# Patient Record
Sex: Male | Born: 1935 | Race: Black or African American | Hispanic: No | Marital: Single | Smoking: Never smoker
Health system: Southern US, Community
[De-identification: ages and names within clinical notes are randomized; demographics above are authoritative.]

## PROBLEM LIST (undated history)

## (undated) DIAGNOSIS — I493 Ventricular premature depolarization: Secondary | ICD-10-CM

## (undated) DIAGNOSIS — I509 Heart failure, unspecified: Secondary | ICD-10-CM

## (undated) DIAGNOSIS — M1990 Unspecified osteoarthritis, unspecified site: Secondary | ICD-10-CM

---

## 1997-06-01 ENCOUNTER — Other Ambulatory Visit: Admission: RE | Admit: 1997-06-01 | Discharge: 1997-06-01 | Payer: Self-pay | Admitting: Family Medicine

## 2009-11-22 ENCOUNTER — Encounter: Admission: RE | Admit: 2009-11-22 | Discharge: 2009-11-22 | Payer: Self-pay | Admitting: Orthopedic Surgery

## 2013-06-15 ENCOUNTER — Encounter (HOSPITAL_COMMUNITY): Payer: Self-pay | Admitting: Emergency Medicine

## 2013-06-15 ENCOUNTER — Inpatient Hospital Stay (HOSPITAL_COMMUNITY)
Admission: EM | Admit: 2013-06-15 | Discharge: 2013-06-18 | DRG: 291 | Disposition: A | Discharge status: Home / Self Care | Payer: Medicare Other | Attending: Internal Medicine | Admitting: Internal Medicine

## 2013-06-15 ENCOUNTER — Emergency Department (HOSPITAL_COMMUNITY): Payer: Medicare Other

## 2013-06-15 DIAGNOSIS — R9431 Abnormal electrocardiogram [ECG] [EKG]: Secondary | ICD-10-CM | POA: Diagnosis present

## 2013-06-15 DIAGNOSIS — Z713 Dietary counseling and surveillance: Secondary | ICD-10-CM

## 2013-06-15 DIAGNOSIS — J9601 Acute respiratory failure with hypoxia: Secondary | ICD-10-CM

## 2013-06-15 DIAGNOSIS — M1710 Unilateral primary osteoarthritis, unspecified knee: Secondary | ICD-10-CM | POA: Diagnosis present

## 2013-06-15 DIAGNOSIS — I5021 Acute systolic (congestive) heart failure: Principal | ICD-10-CM | POA: Diagnosis present

## 2013-06-15 DIAGNOSIS — R5381 Other malaise: Secondary | ICD-10-CM | POA: Diagnosis present

## 2013-06-15 DIAGNOSIS — I428 Other cardiomyopathies: Secondary | ICD-10-CM | POA: Diagnosis present

## 2013-06-15 DIAGNOSIS — I509 Heart failure, unspecified: Secondary | ICD-10-CM | POA: Diagnosis present

## 2013-06-15 DIAGNOSIS — M170 Bilateral primary osteoarthritis of knee: Secondary | ICD-10-CM

## 2013-06-15 DIAGNOSIS — M1990 Unspecified osteoarthritis, unspecified site: Secondary | ICD-10-CM | POA: Diagnosis present

## 2013-06-15 DIAGNOSIS — J9600 Acute respiratory failure, unspecified whether with hypoxia or hypercapnia: Secondary | ICD-10-CM | POA: Diagnosis present

## 2013-06-15 DIAGNOSIS — I420 Dilated cardiomyopathy: Secondary | ICD-10-CM

## 2013-06-15 HISTORY — DX: Unspecified osteoarthritis, unspecified site: M19.90

## 2013-06-15 HISTORY — DX: Ventricular premature depolarization: I49.3

## 2013-06-15 LAB — CBC
HCT: 40.4 % (ref 39.0–52.0)
Hemoglobin: 14.5 g/dL (ref 13.0–17.0)
MCH: 28.2 pg (ref 26.0–34.0)
MCHC: 35.9 g/dL (ref 30.0–36.0)
MCV: 78.6 fL (ref 78.0–100.0)
PLATELETS: 153 10*3/uL (ref 150–400)
RBC: 5.14 MIL/uL (ref 4.22–5.81)
RDW: 15.9 % — ABNORMAL HIGH (ref 11.5–15.5)
WBC: 8.6 10*3/uL (ref 4.0–10.5)

## 2013-06-15 LAB — CBC WITH DIFFERENTIAL/PLATELET
BASOS ABS: 0 10*3/uL (ref 0.0–0.1)
BASOS PCT: 1 % (ref 0–1)
Eosinophils Absolute: 0.1 10*3/uL (ref 0.0–0.7)
Eosinophils Relative: 1 % (ref 0–5)
HCT: 40.6 % (ref 39.0–52.0)
Hemoglobin: 14.6 g/dL (ref 13.0–17.0)
Lymphocytes Relative: 21 % (ref 12–46)
Lymphs Abs: 1.6 10*3/uL (ref 0.7–4.0)
MCH: 28.1 pg (ref 26.0–34.0)
MCHC: 36 g/dL (ref 30.0–36.0)
MCV: 78.1 fL (ref 78.0–100.0)
Monocytes Absolute: 0.7 10*3/uL (ref 0.1–1.0)
Monocytes Relative: 9 % (ref 3–12)
NEUTROS ABS: 5.3 10*3/uL (ref 1.7–7.7)
NEUTROS PCT: 69 % (ref 43–77)
Platelets: 150 10*3/uL (ref 150–400)
RBC: 5.2 MIL/uL (ref 4.22–5.81)
RDW: 15.9 % — AB (ref 11.5–15.5)
WBC: 7.7 10*3/uL (ref 4.0–10.5)

## 2013-06-15 LAB — POTASSIUM: Potassium: 4 mEq/L (ref 3.7–5.3)

## 2013-06-15 LAB — COMPREHENSIVE METABOLIC PANEL
ALBUMIN: 3.4 g/dL — AB (ref 3.5–5.2)
ALK PHOS: 52 U/L (ref 39–117)
ALT: 19 U/L (ref 0–53)
AST: 20 U/L (ref 0–37)
BILIRUBIN TOTAL: 0.7 mg/dL (ref 0.3–1.2)
BUN: 18 mg/dL (ref 6–23)
CHLORIDE: 104 meq/L (ref 96–112)
CO2: 21 mEq/L (ref 19–32)
Calcium: 9.1 mg/dL (ref 8.4–10.5)
Creatinine, Ser: 1.3 mg/dL (ref 0.50–1.35)
GFR calc Af Amer: 53 mL/min — ABNORMAL LOW (ref >90)
GFR calc non Af Amer: 46 mL/min — ABNORMAL LOW (ref >90)
Glucose, Bld: 107 mg/dL — ABNORMAL HIGH (ref 70–99)
POTASSIUM: 4.5 meq/L (ref 3.7–5.3)
SODIUM: 137 meq/L (ref 137–147)
Total Protein: 6.6 g/dL (ref 6.0–8.3)

## 2013-06-15 LAB — TROPONIN I
Troponin I: <0.3 ng/mL (ref <0.30)
Troponin I: <0.3 ng/mL (ref <0.30)
Troponin I: <0.3 ng/mL (ref <0.30)

## 2013-06-15 LAB — CREATININE, SERUM
Creatinine, Ser: 1.38 mg/dL — ABNORMAL HIGH (ref 0.50–1.35)
GFR calc Af Amer: 49 mL/min — ABNORMAL LOW (ref >90)
GFR calc non Af Amer: 42 mL/min — ABNORMAL LOW (ref >90)

## 2013-06-15 LAB — PRO B NATRIURETIC PEPTIDE: PRO B NATRI PEPTIDE: 7124 pg/mL — AB (ref 0–450)

## 2013-06-15 MED ORDER — ENOXAPARIN SODIUM 40 MG/0.4ML ~~LOC~~ SOLN
40.0000 mg | SUBCUTANEOUS | Status: DC
  Administered 2013-06-15 – 2013-06-17 (×3): 40 mg via SUBCUTANEOUS
  Filled 2013-06-15 (×4): qty 0.4

## 2013-06-15 MED ORDER — ASPIRIN EC 81 MG PO TBEC
81.0000 mg | DELAYED_RELEASE_TABLET | Freq: Every day | ORAL | Status: DC
  Administered 2013-06-15 – 2013-06-18 (×4): 81 mg via ORAL
  Filled 2013-06-15 (×4): qty 1

## 2013-06-15 MED ORDER — SODIUM CHLORIDE 0.9 % IJ SOLN
3.0000 mL | INTRAMUSCULAR | Status: DC | PRN
  Administered 2013-06-17 – 2013-06-18 (×2): 3 mL via INTRAVENOUS

## 2013-06-15 MED ORDER — DIAZEPAM 5 MG PO TABS: 5.0000 mg | ORAL_TABLET | Freq: Once | ORAL | Status: DC

## 2013-06-15 MED ORDER — FUROSEMIDE 10 MG/ML IJ SOLN
40.0000 mg | Freq: Once | INTRAMUSCULAR | Status: AC
  Administered 2013-06-15: 40 mg via INTRAVENOUS
  Filled 2013-06-15: qty 4

## 2013-06-15 MED ORDER — SODIUM CHLORIDE 0.9 % IV SOLN: 250.0000 mL | INTRAVENOUS | Status: DC | PRN

## 2013-06-15 MED ORDER — ONDANSETRON HCL 4 MG/2ML IJ SOLN: 4.0000 mg | Freq: Four times a day (QID) | INTRAMUSCULAR | Status: DC | PRN

## 2013-06-15 MED ORDER — FUROSEMIDE 10 MG/ML IJ SOLN
40.0000 mg | Freq: Every day | INTRAMUSCULAR | Status: DC
  Administered 2013-06-15: 40 mg via INTRAVENOUS
  Filled 2013-06-15 (×2): qty 4

## 2013-06-15 MED ORDER — TRAMADOL HCL 50 MG PO TABS: 50.0000 mg | ORAL_TABLET | Freq: Four times a day (QID) | ORAL | Status: DC | PRN

## 2013-06-15 MED ORDER — ASPIRIN 81 MG PO CHEW
324.0000 mg | CHEWABLE_TABLET | Freq: Once | ORAL | Status: AC
  Administered 2013-06-15: 324 mg via ORAL
  Filled 2013-06-15: qty 4

## 2013-06-15 MED ORDER — SODIUM CHLORIDE 0.9 % IJ SOLN
3.0000 mL | Freq: Two times a day (BID) | INTRAMUSCULAR | Status: DC
  Administered 2013-06-15 – 2013-06-17 (×3): 3 mL via INTRAVENOUS

## 2013-06-15 MED ORDER — ACETAMINOPHEN 325 MG PO TABS
650.0000 mg | ORAL_TABLET | Freq: Four times a day (QID) | ORAL | Status: DC | PRN
  Administered 2013-06-15 – 2013-06-17 (×2): 650 mg via ORAL
  Filled 2013-06-15 (×2): qty 2

## 2013-06-15 NOTE — ED Notes (Signed)
Report given-transfer to 4th floor

## 2013-06-15 NOTE — ED Notes (Signed)
Per EMS-increased LE edema in past week, increasingly painful-increased dyspnea with exertion for past week-clear lung sounds-97% of 4L [place], which he states he feels better, 90% on RA-SR with PVCs-18g Left hand

## 2013-06-15 NOTE — ED Notes (Signed)
Attempted to call report

## 2013-06-15 NOTE — ED Provider Notes (Signed)
CSN: [id number]     Arrival date & time 06/15/13  0804 History   First MD Initiated Contact with Patient 06/15/13 [id number]     Chief Complaint  Patient presents with  . Shortness of Breath     (Consider location/radiation/quality/duration/timing/severity/associated sxs/prior Treatment) HPI 80 year old male presents with 3 days of worsening shortness of breath. He has also been having bilateral lower extremity swelling over last couple days. He's been complaining of chest congestion, cough with white sputum, and rhinorrhea. Denies any fevers or chills. When asked about chest pain he states sometimes he gets a tightness in his chest, especially in the morning. Denies any current pain or tightness. No prior cardiac problems besides PVCs. No history of heart failure. His daughter at the bedside lives with him and states that the symptoms seem to be worse in the morning and overnight. It does seem to be worse with lying flat and better with sitting up. He does not normally ambulate. His leg swelling has been symmetric. When oxygen was placed on him on arrival to the ER he states that seemed improved his dyspnea.  Past Medical History  Diagnosis Date  . Arthritis   . PVC (premature ventricular contraction)    History reviewed. No pertinent past surgical history. No family history on file. History  Substance Use Topics  . Smoking status: Never Smoker   . Smokeless tobacco: Not on file  . Alcohol Use: No    Review of Systems  Constitutional: Negative for fever, chills and diaphoresis.  HENT: Positive for congestion and rhinorrhea. Negative for sore throat.   Respiratory: Positive for cough, chest tightness and shortness of breath.   Cardiovascular: Positive for leg swelling. Negative for chest pain.  Gastrointestinal: Negative for nausea, vomiting and abdominal pain.  Neurological: Negative for headaches.  All other systems reviewed and are negative.     Allergies  Review of patient's  allergies indicates not on file.  Home Medications   Prior to Admission medications   Not on File   BP 120/92  Pulse 92  Temp(Src) 97.9 F (36.6 C) (Oral)  Resp 22  SpO2 100% Physical Exam  Nursing note and vitals reviewed. Constitutional: He is oriented to person, place, and time. He appears well-developed and well-nourished. No distress.  HENT:  Head: Normocephalic and atraumatic.  Right Ear: External ear normal.  Left Ear: External ear normal.  Nose: Nose normal.  Mouth/Throat: Oropharynx is clear and moist. No oropharyngeal exudate.  Eyes: Right eye exhibits no discharge. Left eye exhibits no discharge.  Neck: Neck supple.  Cardiovascular: Normal rate, regular rhythm, normal heart sounds and intact distal pulses.   Pulmonary/Chest: Tachypnea (mild) noted. No respiratory distress. He has decreased breath sounds in the right lower field and the left lower field. He has wheezes (scant, intermittent expiratory wheezes).  Abdominal: Soft. He exhibits no distension. There is no tenderness.  Musculoskeletal: He exhibits edema (bilateral, pitting edema to mid lower legs).  Neurological: He is alert and oriented to person, place, and time.  Skin: Skin is warm and dry.    ED Course  Procedures (including critical care time) Labs Review Labs Reviewed  CBC WITH DIFFERENTIAL - Abnormal; Notable for the following:    RDW 15.9 (*)    All other components within normal limits  COMPREHENSIVE METABOLIC PANEL - Abnormal; Notable for the following:    Glucose, Bld 107 (*)    Albumin 3.4 (*)    GFR calc non Af Amer 46 (*)  GFR calc Af Amer 53 (*)    All other components within normal limits  PRO B NATRIURETIC PEPTIDE - Abnormal; Notable for the following:    Pro B Natriuretic peptide (BNP) 7124.0 (*)    All other components within normal limits  TROPONIN I    Imaging Review Dg Chest 2 View  06/15/2013   CLINICAL DATA:  80 year old male cough, wheezing, shortness of Breath.  Initial encounter.  EXAM: CHEST  2 VIEW  COMPARISON:  None.  FINDINGS: Semi upright AP and lateral views of the chest. Cardiomegaly. Evidence of moderate pleural effusions. Patchy and confluent bibasilar opacity. Upper lobes appear more clear. No pneumothorax. No acute osseous abnormality identified.  IMPRESSION: Cardiomegaly with moderate pleural effusions. Patchy bibasilar opacity could reflect basilar predominant pulmonary edema or less likely pneumonia.   Electronically Signed   By: Augusto GambleLee  Hall M.D.   On: 06/15/2013 09:09     EKG Interpretation   Date/Time:  Wednesday Jun 15 2013 08:28:47 EDT Ventricular Rate:  94 PR Interval:  255 QRS Duration: 116 QT Interval:  357 QTC Calculation: 446 R Axis:   47 Text Interpretation:  Sinus rhythm Ventricular premature complex Prolonged  PR interval Nonspecific intraventricular conduction delay Nonspecific T  abnormalities, lateral leads No old tracing to compare Confirmed by  Shimshon Narula  MD, Mayola Mcbain ([id number]) on 06/15/2013 8:39:03 AM      MDM   Final diagnoses:  Congestive heart failure    Patient's sats 90% RA. No respiratory distress but does have mild increased WOB. With his orthopnea and leg swelling his presentation is more c/w CHF rather than PNA. Not c/w PE. No active chest pain.  Given his mild hypoxia and tachypnea he will need admission and echo. Will give lasix IV in ED.    Audree CamelScott T Denita Lun, MD 06/15/13 1026

## 2013-06-15 NOTE — H&P (Signed)
[facility]          History and Physical    PCP:   Foye Spurling, MD   Chief Complaint:  Shortness of breath, chest pain  HPI: Patient is a 80 year old man with past medical history only significant for bilateral knee osteoarthritis for which he takes naproxen. Is followed at the [place]. He presents today with about a 3 to four-day history of progressive shortness of breath. It has worsened to the point where he has difficulty lying down flat at night and has had to increase the amount of pillows that he uses. He also noticed today some substernal chest pressure that did not radiate. He has also had some progressive lower extremity edema. Because of all of these issues his children decided to bring him into the hospital today for evaluation. He is found to have a chest x-ray consistent with pulmonary edema, 2-3+ bilateral lower extremity pitting edema, a BNP of greater than 7000. Hospitalist admission has been requested.  Allergies:  No Known Allergies    Past Medical History  Diagnosis Date  . Arthritis   . PVC (premature ventricular contraction)     History reviewed. No pertinent past surgical history.  Prior to Admission medications   Not on File    Social History:  reports that he has never smoked. He has never used smokeless tobacco. He reports that he does not drink alcohol or use illicit drugs.  History reviewed. No pertinent family history.  Review of Systems:  Constitutional: Denies fever, chills, diaphoresis, appetite change and fatigue.  HEENT: Denies photophobia, eye pain, redness, hearing loss, ear pain, congestion, sore throat, rhinorrhea, sneezing, mouth sores, trouble swallowing, neck pain, neck stiffness and tinnitus.   Respiratory: Denies wheezing.   Cardiovascular: Denies palpitations. Gastrointestinal: Denies nausea, vomiting, abdominal pain, diarrhea, constipation, blood in stool and abdominal distention.  Genitourinary: Denies  dysuria, urgency, frequency, hematuria, flank pain and difficulty urinating.  Endocrine: Denies: hot or cold intolerance, sweats, changes in hair or nails, polyuria, polydipsia. Musculoskeletal: Denies myalgias, back pain, joint swelling, arthralgias and gait problem.  Skin: Denies pallor, rash and wound.  Neurological: Denies dizziness, seizures, syncope, weakness, light-headedness, numbness and headaches.  Hematological: Denies adenopathy. Easy bruising, personal or family bleeding history  Psychiatric/Behavioral: Denies suicidal ideation, mood changes, confusion, nervousness, sleep disturbance and agitation   Physical Exam: Blood pressure 107/75, pulse 87, temperature 97.8 F (36.6 C), temperature source Oral, resp. rate 22, height _0  (1.854 m), SpO2 98.00%. General: Alert, awake, oriented x3, in no distress, hard of hearing. HEENT: Normocephalic, atraumatic, reactive to light, sharp movements intact, moist mucous membranes, poor dentition. Neck: Supple, no JVD, no lymphadenopathy, no bruits, no goiter. Cardiovascular: Regular rate and rhythm, no murmurs, rubs or gallops auscultated. Lungs: Bibasilar crackles. Abdomen: Soft, nontender, nondistended, positive bowel sounds. Extremities: 2-3+ pitting edema bilaterally up to about midshin. Neurologic: Grossly intact and nonfocal.  Labs on Admission:  Results for orders placed during the hospital encounter of 06/15/13 (from the past 48 hour(s))  CBC WITH DIFFERENTIAL     Status: Abnormal   Collection Time    06/15/13  9:15 AM      Result Value Ref Range   WBC 7.7  4.0 - 10.5 K/uL   RBC 5.20  4.22 - 5.81 MIL/uL   Hemoglobin 14.6  13.0 - 17.0 g/dL   HCT 40.6  39.0 - 52.0 %   MCV 78.1  78.0 -  100.0 fL   MCH 28.1  26.0 - 34.0 pg   MCHC 36.0  30.0 - 36.0 g/dL   RDW 15.9 (*) 11.5 - 15.5 %   Platelets 150  150 - 400 K/uL   Neutrophils Relative % 69  43 - 77 %   Neutro Abs 5.3  1.7 - 7.7 K/uL   Lymphocytes Relative 21  12 - 46 %    Lymphs Abs 1.6  0.7 - 4.0 K/uL   Monocytes Relative 9  3 - 12 %   Monocytes Absolute 0.7  0.1 - 1.0 K/uL   Eosinophils Relative 1  0 - 5 %   Eosinophils Absolute 0.1  0.0 - 0.7 K/uL   Basophils Relative 1  0 - 1 %   Basophils Absolute 0.0  0.0 - 0.1 K/uL  COMPREHENSIVE METABOLIC PANEL     Status: Abnormal   Collection Time    06/15/13  9:15 AM      Result Value Ref Range   Sodium 137  137 - 147 mEq/L   Potassium 4.5  3.7 - 5.3 mEq/L   Chloride 104  96 - 112 mEq/L   CO2 21  19 - 32 mEq/L   Glucose, Bld 107 (*) 70 - 99 mg/dL   BUN 18  6 - 23 mg/dL   Creatinine, Ser 1.30  0.50 - 1.35 mg/dL   Calcium 9.1  8.4 - 10.5 mg/dL   Total Protein 6.6  6.0 - 8.3 g/dL   Albumin 3.4 (*) 3.5 - 5.2 g/dL   AST 20  0 - 37 U/L   ALT 19  0 - 53 U/L   Alkaline Phosphatase 52  39 - 117 U/L   Total Bilirubin 0.7  0.3 - 1.2 mg/dL   GFR calc non Af Amer 46 (*) >90 mL/min   GFR calc Af Amer 53 (*) >90 mL/min   Comment: (NOTE)     The eGFR has been calculated using the CKD EPI equation.     This calculation has not been validated in all clinical situations.     eGFR's persistently <90 mL/min signify possible Chronic Kidney     Disease.  TROPONIN I     Status: None   Collection Time    06/15/13  9:15 AM      Result Value Ref Range   Troponin I <0.30  <0.30 ng/mL   Comment:            Due to the release kinetics of cTnI,     a negative result within the first hours     of the onset of symptoms does not rule out     myocardial infarction with certainty.     If myocardial infarction is still suspected,     repeat the test at appropriate intervals.  PRO B NATRIURETIC PEPTIDE     Status: Abnormal   Collection Time    06/15/13  9:15 AM      Result Value Ref Range   Pro B Natriuretic peptide (BNP) 7124.0 (*) 0 - 450 pg/mL    Radiological Exams on Admission: Dg Chest 2 View  06/15/2013   CLINICAL DATA:  80 year old male cough, wheezing, shortness of Breath. Initial encounter.  EXAM: CHEST  2 VIEW   COMPARISON:  None.  FINDINGS: Semi upright AP and lateral views of the chest. Cardiomegaly. Evidence of moderate pleural effusions. Patchy and confluent bibasilar opacity. Upper lobes appear more clear. No pneumothorax. No acute osseous abnormality identified.  IMPRESSION: Cardiomegaly  with moderate pleural effusions. Patchy bibasilar opacity could reflect basilar predominant pulmonary edema or less likely pneumonia.   Electronically Signed   By: Lars Pinks M.D.   On: 06/15/2013 09:09    Assessment/Plan Principal Problem:   Acute CHF Active Problems:   Acute respiratory failure with hypoxia   Osteoarthritis of both knees    Acute hypoxemic respiratory failure -Likely related to acute CHF, doubt pneumonia given lack of clinical findings. -Please see below for details.  Acute CHF, type unknown, likely diastolic -Chest x-ray consistent with pulmonary edema, BNP greater than 7000. -He feels like his shortness of breath has already improved with the Lasix that he received in the emergency department. -Check 2-D echo, cycle troponins, strict intake and output, daily weights, strive for negative fluid balance.  DVT prophylaxis -Lovenox.  CODE STATUS -Full code as discussed with patient and 4 children at bedside.    Time Spent on Admission: 75 minutes  [facility] Pager: [phone] 06/15/2013, 3:43 PM

## 2013-06-15 NOTE — ED Notes (Signed)
Bed: FT73 Expected date:  Expected time:  Means of arrival:  Comments: ems 80yo SOB

## 2013-06-15 NOTE — ED Notes (Signed)
Pt has urinal at bedside at his request, was instructed to call out when he had urinated for sample collection

## 2013-06-15 NOTE — Care Management Note (Addendum)
    Page 1 of 1   06/16/2013     4:37:15 PM CARE MANAGEMENT NOTE 06/16/2013  Patient:  AYDIN, BANO A   Account Number:  [id number]  Date Initiated:  06/15/2013  Documentation initiated by:  [facility]  Subjective/Objective Assessment:   80 Y/O M ADMITTED W/CHF.     Action/Plan:   FROM HOME W/GOOD FAMILY SUPPORT.HAS PCP,PHARMACY.   Anticipated DC Date:  06/20/2013   Anticipated DC Plan:  HOME W HOME HEALTH SERVICES      DC Planning Services  CM consult      Choice offered to / List presented to:             Status of service:  In process, will continue to follow Medicare Important Message given?   (If response is "NO", the following Medicare IM given date fields will be blank) Date Medicare IM given:   Date Additional Medicare IM given:    Discharge Disposition:    Per UR Regulation:  Reviewed for med. necessity/level of care/duration of stay  If discussed at Long Length of Stay Meetings, dates discussed:    Comments:  06/16/13 Tamaiya Bump RN,BSN NCM [phone] FAMILY DECLINES HHC.[facility] PROVIDES ASST W/ADL'S.   06/15/13 Jacqui Headen RN,BSN NCM [phone] WOULD RECOMMEND PT CONS.

## 2013-06-16 DIAGNOSIS — IMO0002 Reserved for concepts with insufficient information to code with codable children: Secondary | ICD-10-CM

## 2013-06-16 DIAGNOSIS — M1710 Unilateral primary osteoarthritis, unspecified knee: Secondary | ICD-10-CM

## 2013-06-16 LAB — BASIC METABOLIC PANEL
BUN: 22 mg/dL (ref 6–23)
CHLORIDE: 104 meq/L (ref 96–112)
CO2: 25 meq/L (ref 19–32)
Calcium: 9.2 mg/dL (ref 8.4–10.5)
Creatinine, Ser: 1.42 mg/dL — ABNORMAL HIGH (ref 0.50–1.35)
GFR calc Af Amer: 48 mL/min — ABNORMAL LOW (ref >90)
GFR calc non Af Amer: 41 mL/min — ABNORMAL LOW (ref >90)
GLUCOSE: 93 mg/dL (ref 70–99)
POTASSIUM: 4.1 meq/L (ref 3.7–5.3)
Sodium: 141 mEq/L (ref 137–147)

## 2013-06-16 MED ORDER — ALUM & MAG HYDROXIDE-SIMETH 200-200-20 MG/5ML PO SUSP
30.0000 mL | ORAL | Status: DC | PRN
  Administered 2013-06-16: 30 mL via ORAL
  Filled 2013-06-16: qty 30

## 2013-06-16 MED ORDER — FUROSEMIDE 10 MG/ML IJ SOLN
20.0000 mg | Freq: Two times a day (BID) | INTRAMUSCULAR | Status: DC
  Administered 2013-06-16 – 2013-06-17 (×4): 20 mg via INTRAVENOUS
  Filled 2013-06-16 (×6): qty 2

## 2013-06-16 MED ORDER — BISACODYL 10 MG RE SUPP
10.0000 mg | Freq: Every day | RECTAL | Status: DC | PRN
Start: 2013-06-16 — End: 2013-06-18
  Administered 2013-06-16: 10 mg via RECTAL
  Filled 2013-06-16: qty 1

## 2013-06-16 NOTE — Progress Notes (Signed)
Echocardiogram 2D Echocardiogram has been performed.  Derek Simpson 06/16/2013, 12:09 PM

## 2013-06-16 NOTE — Progress Notes (Signed)
[facility] PROGRESS NOTE  Derek Simpson [id number] DOB: 09-30-1918 DOA: 06/15/2013 PCP: Laurena SlimmerLARK,PRESTON S, MD  Assessment/Plan:  Acute CHF, type unknown, likely diastolic  -Chest x-ray consistent with pulmonary edema, BNP greater than 7000.  -continue IV lasix low dose -FU ECHO -negative 5L -advised to stop NSAIDs -diet education completed  OA -avoid NSAIDs due to 1 -tylenol PRN  DVT prophylaxis  -Lovenox.   CODE STATUS -Full code  Communication: d/w daughter at bedside Dispo: home in 1-2days  HPI/Subjective: Feels better  Objective: Filed Vitals:   06/16/13 0515  BP: 100/65  Pulse: 81  Temp: 98.1 F (36.7 C)  Resp: 20    Intake/Output Summary (Last 24 hours) at 06/16/13 1237 Last data filed at 06/16/13 1100  Gross per 24 hour  Intake    600 ml  Output   3870 ml  Net  -3270 ml   Filed Weights   06/15/13 1325 06/16/13 0420  Weight: 78.472 kg (173 lb) 74.345 kg (163 lb 14.4 oz)    Exam:   General:  AAOx3  Cardiovascular: S1S2/RRR  Respiratory: fne basilar crackles  Abdomen: soft, Nt, BS present  Musculoskeletal: trace edema    Data Reviewed: Basic Metabolic Panel:  Recent Labs Lab 06/15/13 0915 06/15/13 1632 06/15/13 2230 06/16/13 0316  NA 137  --   --  141  K 4.5  --  4.0 4.1  CL 104  --   --  104  CO2 21  --   --  25  GLUCOSE 107*  --   --  93  BUN 18  --   --  22  CREATININE 1.30 1.38*  --  1.42*  CALCIUM 9.1  --   --  9.2   Liver Function Tests:  Recent Labs Lab 06/15/13 0915  AST 20  ALT 19  ALKPHOS 52  BILITOT 0.7  PROT 6.6  ALBUMIN 3.4*   No results found for this basename: LIPASE, AMYLASE,  in the last 168 hours No results found for this basename: AMMONIA,  in the last 168 hours CBC:  Recent Labs Lab 06/15/13 0915 06/15/13 1632  WBC 7.7 8.6  NEUTROABS 5.3  --   HGB 14.6 14.5  HCT 40.6 40.4  MCV 78.1 78.6  PLT 150 153   Cardiac Enzymes:  Recent Labs Lab 06/15/13 0915 06/15/13 1632  06/15/13 2230  TROPONINI <0.30 <0.30 <0.30   BNP (last 3 results)  Recent Labs  06/15/13 0915  PROBNP 7124.0*   CBG: No results found for this basename: GLUCAP,  in the last 168 hours  No results found for this or any previous visit (from the past 240 hour(s)).   Studies: Dg Chest 2 View  06/15/2013   CLINICAL DATA:  80 year old male cough, wheezing, shortness of Breath. Initial encounter.  EXAM: CHEST  2 VIEW  COMPARISON:  None.  FINDINGS: Semi upright AP and lateral views of the chest. Cardiomegaly. Evidence of moderate pleural effusions. Patchy and confluent bibasilar opacity. Upper lobes appear more clear. No pneumothorax. No acute osseous abnormality identified.  IMPRESSION: Cardiomegaly with moderate pleural effusions. Patchy bibasilar opacity could reflect basilar predominant pulmonary edema or less likely pneumonia.   Electronically Signed   By: Augusto GambleLee  Hall M.D.   On: 06/15/2013 09:09    Scheduled Meds: . aspirin EC  81 mg Oral Daily  . diazepam  5 mg Oral Once  . enoxaparin (LOVENOX) injection  40 mg Subcutaneous Q24H  . furosemide  20 mg Intravenous Q12H  . sodium  chloride  3 mL Intravenous Q12H   Continuous Infusions:  Antibiotics Given (last 72 hours)   None      Principal Problem:   Acute CHF Active Problems:   Acute respiratory failure with hypoxia   Osteoarthritis of both knees    Time spent:    Zannie Cove  [facility] Pager [phone]. If 7PM-7AM, please contact night-coverage at [email or web address], password [facility] 06/16/2013, 12:37 PM  LOS: 1 day

## 2013-06-16 NOTE — Progress Notes (Signed)
Pharmacist Heart Failure Core Measure Documentation  Assessment: Derek Simpson has an EF documented as 15-20% on 06/16/13 by ECHO.  Rationale: Heart failure patients with left ventricular systolic dysfunction (LVSD) and an EF < 40% should be prescribed an angiotensin converting enzyme inhibitor (ACEI) or angiotensin receptor blocker (ARB) at discharge unless a contraindication is documented in the medical record.  This patient is not currently on an ACEI or ARB for HF.  This note is being placed in the record in order to provide documentation that a contraindication to the use of these agents is present for this encounter.  ACE Inhibitor or Angiotensin Receptor Blocker is contraindicated (specify all that apply)  []   ACEI allergy AND ARB allergy []   Angioedema []   Moderate or severe aortic stenosis []   Hyperkalemia [x]   Hypotension []   Renal artery stenosis [x]   Worsening renal function, preexisting renal disease or dysfunction   [name] 06/16/2013 2:16 PM

## 2013-06-16 NOTE — Evaluation (Signed)
Physical Therapy Evaluation Patient Details Name: Derek Simpson MRN: [id number] DOB: 1918/08/17 Today's Date: 06/16/2013   History of Present Illness  80 yo male admitted with acute CHF. Hx of bilateral knee severe arthritis, L worse than R, per pt.   Clinical Impression  On eval, pt required Min assist for mobility-able to ambulate ~20 feet with walker. Demonstrates general weakness, decreased activity tolerance and impaired gait and balance. May benefit from HHPT to maximize independence and safety with mobility.     Follow Up Recommendations Supervision/Assistance - 24 hour;Home health PT    Equipment Recommendations  None recommended by PT    Recommendations for Other Services OT consult     Precautions / Restrictions Precautions Precautions: Fall Restrictions Weight Bearing Restrictions: No      Mobility  Bed Mobility Overal bed mobility: Needs Assistance Bed Mobility: Supine to Sit     Supine to sit: HOB elevated;Min guard Sit to supine: Min assist   General bed mobility comments: Increased time.   Transfers Overall transfer level: Needs assistance Equipment used: Rolling walker (2 wheeled) Transfers: Sit to/from Stand Sit to Stand: Min assist Stand pivot transfers: Min assist       General transfer comment: Assist to rise, stabilize, control descent. VCS safey, hand placement  Ambulation/Gait Ambulation/Gait assistance: Min assist Ambulation Distance (Feet): 20 Feet Assistive device: Rolling walker (2 wheeled) Gait Pattern/deviations: Trunk flexed;Decreased stride length;Decreased step length - right;Step-to pattern     General Gait Details: very slow gait speed. R leg tends to lag behind. Distance limited by pain in knees, fatigue. Assist to stabilize thorughout ambulation and to maneuver with RW.   Stairs            Wheelchair Mobility    Modified Rankin (Stroke Patients Only)       Balance Overall balance assessment: Needs  assistance Sitting-balance support: Bilateral upper extremity supported;Feet supported Sitting balance-Leahy Scale: Fair     Standing balance support: Bilateral upper extremity supported;During functional activity Standing balance-Leahy Scale: Poor                               Pertinent Vitals/Pain Knees- 5/10     Home Living Family/patient expects to be discharged to:: Private residence Living Arrangements: Children Available Help at Discharge: Family Type of Home: House Home Access: Stairs to enter Entrance Stairs-Rails: Right Entrance Stairs-Number of Steps: 4 steps in front/ there is a ramp in the back.  Pt uses front entrance and back when he goes out to feed chickens Home Layout: One level Home Equipment: Emergency planning/management officer - 2 wheels;Cane - single point;Bedside commode;Wheelchair - IT trainer      Prior Function Level of Independence: Independent with assistive device(s)         Comments: has supervision, but was supervision at [facility] level     Hand Dominance        Extremity/Trunk Assessment   Upper Extremity Assessment: Defer to OT evaluation           Lower Extremity Assessment: Generalized weakness (flexed knees bilaterally)      Cervical / Trunk Assessment: Kyphotic  Communication   Communication: HOH  Cognition Arousal/Alertness: Awake/alert Behavior During Therapy: WFL for tasks assessed/performed Overall Cognitive Status: Within Functional Limits for tasks assessed                      General Comments      Exercises  Assessment/Plan    PT Assessment Patient needs continued PT services  PT Diagnosis Difficulty walking;Abnormality of gait;Generalized weakness   PT Problem List Decreased strength;Decreased range of motion;Decreased activity tolerance;Decreased balance;Decreased mobility;Pain  PT Treatment Interventions DME instruction;Gait training;Functional mobility training;Therapeutic  activities;Therapeutic exercise;Patient/family education;Balance training   PT Goals (Current goals can be found in the Care Plan section) Acute Rehab PT Goals Patient Stated Goal: get back home PT Goal Formulation: With patient/family Time For Goal Achievement: 06/30/13 Potential to Achieve Goals: Fair    Frequency Min 3X/week   Barriers to discharge        Co-evaluation               End of Session Equipment Utilized During Treatment: Gait belt Activity Tolerance: Patient limited by pain;Patient limited by fatigue Patient left: in chair;with call bell/phone within reach;with family/visitor present           Time: 1015-1027 PT Time Calculation (min): 12 min   Charges:   PT Evaluation $Initial PT Evaluation Tier I: 1 Procedure PT Treatments $Gait Training: 8-22 mins   PT G Codes:          Rebeca AlertJannie Taylie Helder, MPT Pager: [phone]

## 2013-06-16 NOTE — Evaluation (Signed)
Occupational Therapy Evaluation Patient Details Name: Derek Simpson MRN: [id number] DOB: 1919-02-04 Today's Date: 06/16/2013    History of Present Illness 80 yo male admitted with acute CHF. Hx of bilateral knee severe arthritis, L worse than R, per pt.    Clinical Impression   Pt presents to OT with decreased I with ADL activity and will benefit from skilled OT to address problems listed below    Follow Up Recommendations  Home health OT    Equipment Recommendations  None recommended by OT    Recommendations for Other Services       Precautions / Restrictions Precautions Precautions: Fall Restrictions Weight Bearing Restrictions: No      Mobility Bed Mobility Overal bed mobility: Needs Assistance Bed Mobility: Supine to Sit     Supine to sit: HOB elevated;Min guard Sit to supine: Min assist   General bed mobility comments: Increased time.   Transfers Overall transfer level: Needs assistance Equipment used: Rolling walker (2 wheeled) Transfers: Sit to/from Stand Sit to Stand: Min assist Stand pivot transfers: Min assist       General transfer comment: Assist to rise, stabilize, control descent. VCS safey, hand placement    Balance Overall balance assessment: Needs assistance Sitting-balance support: Bilateral upper extremity supported;Feet supported Sitting balance-Leahy Scale: Fair     Standing balance support: Bilateral upper extremity supported;During functional activity Standing balance-Leahy Scale: Poor                              ADL Overall ADL's : Needs assistance/impaired     Grooming: Set up;Sitting   Upper Body Bathing: Set up;Sitting   Lower Body Bathing: Moderate assistance;Sit to/from stand   Upper Body Dressing : Set up;Sitting   Lower Body Dressing: Moderate assistance   Toilet Transfer: Minimal assistance Toilet Transfer Details (indicate cue type and reason): chair to bed. ECHO tech came to perform test and  needed pt to get back to bed Toileting- Clothing Manipulation and Hygiene: Moderate assistance;Sit to/from stand         General ADL Comments: Daugther shared pt sometimes walks with 2 canes to get in bathroom.  Explained walker safer for pt and he would need A to get in and out of bathroom.      Vision                      Extremity/Trunk Assessment Upper Extremity Assessment Upper Extremity Assessment: Defer to OT evaluation   Lower Extremity Assessment Lower Extremity Assessment: Generalized weakness (flexed knees bilaterally)   Cervical / Trunk Assessment Cervical / Trunk Assessment: Kyphotic   Communication Communication Communication: HOH   Cognition Arousal/Alertness: Awake/alert Behavior During Therapy: WFL for tasks assessed/performed Overall Cognitive Status: Within Functional Limits for tasks assessed                                Home Living Family/patient expects to be discharged to:: Private residence Living Arrangements: Children Available Help at Discharge: Family Type of Home: House Home Access: Stairs to enter Entergy CorporationEntrance Stairs-Number of Steps: 4 steps in front/ there is a ramp in the back.  Pt uses front entrance and back when he goes out to feed chickens Entrance Stairs-Rails: Right Home Layout: One level     Bathroom Shower/Tub: Tub/shower unit         Home Equipment: Emergency planning/management officerhower seat;Walker - 2  wheels;Cane - single point;Bedside commode;Wheelchair - IT trainer          Prior Functioning/Environment Level of Independence: Independent with assistive device(s)        Comments: has supervision, but was supervision at [facility] level    OT Diagnosis: Generalized weakness;Acute pain   OT Problem List: Decreased strength;Decreased activity tolerance   OT Treatment/Interventions: Self-care/ADL training;DME and/or AE instruction;Patient/family education    OT Goals(Current goals can be found in the care plan section)  Acute Rehab OT Goals Patient Stated Goal: get back home OT Goal Formulation: With patient Time For Goal Achievement: 06/30/13 Potential to Achieve Goals: Good ADL Goals Pt Will Perform Grooming: with supervision;standing Pt Will Perform Upper Body Dressing: with supervision;sitting Pt Will Perform Lower Body Dressing: with min assist;sit to/from stand Pt Will Transfer to Toilet: with min guard assist;ambulating Pt Will Perform Toileting - Clothing Manipulation and hygiene: with min guard assist;sit to/from stand  OT Frequency: Min 2X/week    End of Session Equipment Utilized During Treatment: Engineer, water Communication: Mobility status  Activity Tolerance: Patient tolerated treatment well Patient left: in chair;with family/visitor present;with call bell/phone within reach   Time: 1042-1058 OT Time Calculation (min): 16 min Charges:  OT General Charges $OT Visit: 1 Procedure OT Evaluation $Initial OT Evaluation Tier I: 1 Procedure OT Treatments $Self Care/Home Management : 8-22 mins G-Codes:    Alba Cory 06-30-13, 2:12 PM

## 2013-06-17 DIAGNOSIS — I428 Other cardiomyopathies: Secondary | ICD-10-CM

## 2013-06-17 DIAGNOSIS — I420 Dilated cardiomyopathy: Secondary | ICD-10-CM | POA: Diagnosis present

## 2013-06-17 DIAGNOSIS — R9431 Abnormal electrocardiogram [ECG] [EKG]: Secondary | ICD-10-CM | POA: Diagnosis present

## 2013-06-17 DIAGNOSIS — I5021 Acute systolic (congestive) heart failure: Principal | ICD-10-CM

## 2013-06-17 LAB — BASIC METABOLIC PANEL
BUN: 18 mg/dL (ref 6–23)
CHLORIDE: 100 meq/L (ref 96–112)
CO2: 26 meq/L (ref 19–32)
Calcium: 9.2 mg/dL (ref 8.4–10.5)
Creatinine, Ser: 1.32 mg/dL (ref 0.50–1.35)
GFR calc Af Amer: 52 mL/min — ABNORMAL LOW (ref >90)
GFR calc non Af Amer: 45 mL/min — ABNORMAL LOW (ref >90)
Glucose, Bld: 94 mg/dL (ref 70–99)
Potassium: 4.2 mEq/L (ref 3.7–5.3)
Sodium: 138 mEq/L (ref 137–147)

## 2013-06-17 NOTE — Consult Note (Signed)
Admit date: 06/15/2013 Referring Physician  Dr. Jomarie LongsJoseph Primary Physician  Dr. Chestine Sporelark Primary Cardiologist  NONE Reason for Consultation  CHF  HPI: Patient is a [age] year old man with past medical history only significant for bilateral knee osteoarthritis for which he takes naproxen. [facility]. He presented with about a 3 to four-day history of progressive shortness of breath. It has worsened to the point where he has difficulty lying down flat at night and has had to increase the amount of pillows that he uses. He also noticed on day of admission some substernal chest pressure that did not radiate. He has also had some progressive lower extremity edema. Because of all of these issues his children decided to bring him into the hospital today for evaluation. He is found to have a chest x-ray consistent with pulmonary edema, 2-3+ bilateral lower extremity pitting edema, a BNP of greater than 7000. Hospitalist admission has been requested.  BNP was elevated at 7124 and cardiac markers are normal.  2D echo done showed severe LV dysfunction with EF 15-20% with diffuse hypokinesis.  He has been on IV lasix with good diuresis.  He denies any chest pain or SOB up until 3 days ago.  He is active at home.   PMH:   Past Medical History  Diagnosis Date  . Arthritis   . PVC (premature ventricular contraction)      PSH:  History reviewed. No pertinent past surgical history.  Allergies:  Review of patient's allergies indicates no known allergies. Prior to Admit Meds:   Prescriptions prior to admission  Medication Sig Dispense Refill  . naproxen (NAPROSYN) 500 MG tablet Take 500 mg by mouth 2 (two) times daily with a meal.       Fam HX:   History reviewed. No pertinent family history. Social HX:    History   Social History  . Marital Status: Single    Spouse Name: N/A    Number of Children: N/A  . Years of Education: N/A   Occupational History  . Not on file.   Social History Main Topics    . Smoking status: Never Smoker   . Smokeless tobacco: Never Used  . Alcohol Use: No  . Drug Use: No  . Sexual Activity: No   Other Topics Concern  . Not on file   Social History Narrative  . No narrative on file     ROS:  All 11 ROS were addressed and are negative except what is stated in the HPI  Physical Exam: Blood pressure 99/62, pulse 77, temperature 98 F (36.7 C), temperature source Oral, resp. rate 22, height 6\' 1"  (1.854 m), weight 160 lb 4.8 oz (72.712 kg), SpO2 100.00%.    General: Well developed, well nourished, in no acute distress Head: Eyes PERRLA, No xanthomas.   Normal cephalic and atramatic  Lungs:   Few crackles at bases Heart:   HRRR S1 S2 Pulses are 2+ & equal.            No carotid bruit. No JVD.  No abdominal bruits. No femoral bruits. Abdomen: Bowel sounds are positive, abdomen soft and non-tender without masses  Extremities:   No clubbing, cyanosis or edema.  DP +1 Neuro: Alert and oriented X 3. Psych:  Good affect, responds appropriately    Labs:   Lab Results  Component Value Date   WBC 8.6 06/15/2013   HGB 14.5 06/15/2013   HCT 40.4 06/15/2013   MCV 78.6 06/15/2013   PLT  153 06/15/2013    Recent Labs Lab 06/15/13 0915  06/17/13 0447  NA 137  < > 138  K 4.5  < > 4.2  CL 104  < > 100  CO2 21  < > 26  BUN 18  < > 18  CREATININE 1.30  < > 1.32  CALCIUM 9.1  < > 9.2  PROT 6.6  --   --   BILITOT 0.7  --   --   ALKPHOS 52  --   --   ALT 19  --   --   AST 20  --   --   GLUCOSE 107*  < > 94  < > = values in this interval not displayed. No results found for this basename: PTT   No results found for this basename: INR, PROTIME   Lab Results  Component Value Date   TROPONINI <0.30 06/15/2013         Radiology:  No results found.  EKG:  NSR with lateral T wave abnormality, PAC's and first degress AV block  ASSESSMENT:  1.  Acute systolic CHF of unclear etiology - idiopathic vs. CAD vs. Viral (nor recent illness) He has diuresed 7L but  still has a few crackles at bases.   2.  Abnormal EKG with lateral ST changes 3.  History of PVC's  PLAN:   1.  Continue IV Lasix one more day and if lungs clear change to PO in am 2.  BP too low for now to add BB or ACE I - hopefully will be able to add prior to d/c 3.  Discussed options of medical management vs. Nuclear stress testing and if abnormal then cath but patient and family at this time want to take a more conservative route with medical managament. 4.  Continue ASA  Quintella Reichert, MD  06/17/2013  9:20 AM

## 2013-06-17 NOTE — Progress Notes (Addendum)
[facility] PROGRESS NOTE  Derek KhanZack A Simpson NWG:[id number] DOB: 16-Mar-1918 DOA: 06/15/2013 PCP: Laurena SlimmerLARK,PRESTON S, MD  Assessment/Plan:  Acute Systolic CHF -Chest x-ray consistent with pulmonary edema, BNP greater than 7000.  -continue lasix, ? Change to PO, almost euvolemic now, await Cards input -2D ECHO with EF of 15-20% diffuse hypokinesis -BP on lower side, hold off on ACE for now -Cards consult requested -negative 7.8L -advised to stop NSAIDs -diet education completed  OA -avoid NSAIDs due to 1 -tylenol PRN  DVT prophylaxis  -Lovenox.   CODE STATUS -Full code  Communication: d/w daughter at bedside Dispo: home pending workup  HPI/Subjective: Feels better  Objective: Filed Vitals:   06/17/13 0602  BP: 99/62  Pulse: 77  Temp: 98 F (36.7 C)  Resp: 22    Intake/Output Summary (Last 24 hours) at 06/17/13 0809 Last data filed at 06/17/13 0556  Gross per 24 hour  Intake    450 ml  Output   3200 ml  Net  -2750 ml   Filed Weights   06/15/13 1325 06/16/13 0420 06/17/13 0602  Weight: 78.472 kg (173 lb) 74.345 kg (163 lb 14.4 oz) 72.712 kg (160 lb 4.8 oz)    Exam:   General:  AAOx3  Cardiovascular: S1S2/RRR  Respiratory: CTAB  Abdomen: soft, Nt, BS present  Musculoskeletal: no edema    Data Reviewed: Basic Metabolic Panel:  Recent Labs Lab 06/15/13 0915 06/15/13 1632 06/15/13 2230 06/16/13 0316 06/17/13 0447  NA 137  --   --  141 138  K 4.5  --  4.0 4.1 4.2  CL 104  --   --  104 100  CO2 21  --   --  25 26  GLUCOSE 107*  --   --  93 94  BUN 18  --   --  22 18  CREATININE 1.30 1.38*  --  1.42* 1.32  CALCIUM 9.1  --   --  9.2 9.2   Liver Function Tests:  Recent Labs Lab 06/15/13 0915  AST 20  ALT 19  ALKPHOS 52  BILITOT 0.7  PROT 6.6  ALBUMIN 3.4*   No results found for this basename: LIPASE, AMYLASE,  in the last 168 hours No results found for this basename: AMMONIA,  in the last 168 hours CBC:  Recent Labs Lab  06/15/13 0915 06/15/13 1632  WBC 7.7 8.6  NEUTROABS 5.3  --   HGB 14.6 14.5  HCT 40.6 40.4  MCV 78.1 78.6  PLT 150 153   Cardiac Enzymes:  Recent Labs Lab 06/15/13 0915 06/15/13 1632 06/15/13 2230  TROPONINI <0.30 <0.30 <0.30   BNP (last 3 results)  Recent Labs  06/15/13 0915  PROBNP 7124.0*   CBG: No results found for this basename: GLUCAP,  in the last 168 hours  No results found for this or any previous visit (from the past 240 hour(s)).   Studies: Dg Chest 2 View  06/15/2013   CLINICAL DATA:  80 year old male cough, wheezing, shortness of Breath. Initial encounter.  EXAM: CHEST  2 VIEW  COMPARISON:  None.  FINDINGS: Semi upright AP and lateral views of the chest. Cardiomegaly. Evidence of moderate pleural effusions. Patchy and confluent bibasilar opacity. Upper lobes appear more clear. No pneumothorax. No acute osseous abnormality identified.  IMPRESSION: Cardiomegaly with moderate pleural effusions. Patchy bibasilar opacity could reflect basilar predominant pulmonary edema or less likely pneumonia.   Electronically Signed   By: Augusto GambleLee  Hall M.D.   On: 06/15/2013 09:09    Scheduled  Meds: . aspirin EC  81 mg Oral Daily  . diazepam  5 mg Oral Once  . enoxaparin (LOVENOX) injection  40 mg Subcutaneous Q24H  . furosemide  20 mg Intravenous Q12H  . sodium chloride  3 mL Intravenous Q12H   Continuous Infusions:  Antibiotics Given (last 72 hours)   None      Principal Problem:   Acute CHF Active Problems:   Acute respiratory failure with hypoxia   Osteoarthritis of both knees    Time spent:    Zannie Cove  [facility] Pager [phone]. If 7PM-7AM, please contact night-coverage at [email or web address], password [facility] 06/17/2013, 8:09 AM  LOS: 2 days

## 2013-06-17 NOTE — Progress Notes (Signed)
Occupational Therapy Treatment Patient Details Name: Derek KhanZack A Simpson MRN: [id number] DOB: May 04, 1918 Today's Date: 06/17/2013    History of present illness 80 yo male admitted with acute CHF. Hx of bilateral knee severe arthritis, L worse than R, per pt.    OT comments  Pt needs constant min assist and verbal cues during toilet transfers and clothing/hygiene management. He tends to move quickly at times and can be unsafe with walker at times also. He will need 24/7 assist at d/c and discussed this with daughter who is working on arranging this.    Follow Up Recommendations  Home health OT;Supervision/Assistance - 24 hour (pt refuses SNF. Recommend 24/7 assist. Family trying to arrange this.)    Equipment Recommendations  None recommended by OT    Recommendations for Other Services      Precautions / Restrictions Precautions Precautions: Fall Restrictions Weight Bearing Restrictions: No       Mobility Bed Mobility Overal bed mobility: Needs Assistance Bed Mobility: Supine to Sit     Supine to sit: [facility] elevated;Min assist     General bed mobility comments: Increased time.   Transfers Overall transfer level: Needs assistance Equipment used: Rolling walker (2 wheeled) Transfers: Sit to/from Stand Sit to Stand: Min assist         General transfer comment: verbal cues for hand placement, assist to rise and steady, control descent. pt doesnt tend to reach back for armrests unless cued.    Balance                                   ADL                           Toilet Transfer: Minimal assistance;Ambulation;BSC (used standard walker in room)   Toileting- Clothing Manipulation and Hygiene: Moderate assistance;Sit to/from stand         General ADL Comments: Daughter present during most of session and reinforced that pt uses wheelchair part of the way to the bathroom and home but it wont fit through doorway so he then uses 2 canes to get into  bathroom. Again reinforced that it would be safer for pt to use the walker to transfer into bathroom and if he is in urgent need of toilet, to use 3in1 as BSC closer to where he is. Discussed 3in1 use for safety to have armrests and increased height to transfer on and off of as pt reports difficulty due to arthritic knees. Pt will need 24/7 at d/c and discussed SNF but he refuses. Discussed having more assist at home and daugther will look into other family member that may be able to provide 24/7. Pt is impulsive at times with activity and tried to pick up walker several times to  step around to the 3in1. Instructed pt to keep walker on the floor and side step into tighter spaces.      Vision                     Perception     Praxis      Cognition   Behavior During Therapy: [facility] HospitalWFL for tasks assessed/performed Overall Cognitive Status: Within Functional Limits for tasks assessed                       Extremity/Trunk Assessment  Exercises     Shoulder Instructions       General Comments      Pertinent Vitals/ Pain       Pt states "not too bad" but does states his knees bother him; reposition.  Home Living                                          Prior Functioning/Environment              Frequency Min 2X/week     Progress Toward Goals  OT Goals(current goals can now be found in the care plan section)  Progress towards OT goals: Progressing toward goals     Plan Discharge plan remains appropriate    Co-evaluation                 End of Session Equipment Utilized During Treatment: Rolling walker   Activity Tolerance Patient tolerated treatment well   Patient Left in chair;with chair alarm set;with family/visitor present   Nurse Communication          Time: 7619-5093 OT Time Calculation (min): 34 min  Charges: OT General Charges $OT Visit: 1 Procedure OT Treatments $Self Care/Home Management :  8-22 mins $Therapeutic Activity: 8-22 mins  Sabino Gasser Shavontae Gibeault [phone] 06/17/2013, 12:30 PM

## 2013-06-17 NOTE — Progress Notes (Signed)
Physical Therapy Treatment Patient Details Name: Derek Simpson MRN: [id number] DOB: [birth year] Today's Date: 06/17/2013    History of Present Illness 80 yo male admitted with acute CHF. Hx of bilateral knee severe arthritis, L worse than R, per pt.     PT Comments    Assisted pt OOB with increased time then amb limited distance in hallway with SW.  Limited by B knee pain (arthritis) and c/o fatigue. Assisted back to bed.  Family present and helpful.  Follow Up Recommendations  Supervision/Assistance - 24 hour;Home health PT     Equipment Recommendations  None recommended by PT    Recommendations for Other Services       Precautions / Restrictions Precautions Precautions: Fall Precaution Comments: B RA knees (pain) Restrictions Weight Bearing Restrictions: No    Mobility  Bed Mobility Overal bed mobility: Needs Assistance Bed Mobility: Supine to Sit;Sit to Supine     Supine to sit: HOB elevated;Min assist Sit to supine: Min assist   General bed mobility comments: Increased time.   Transfers Overall transfer level: Needs assistance Equipment used: Standard walker Transfers: Sit to/from Stand Sit to Stand: Min assist         General transfer comment: verbal cues for hand placement, assist to rise and steady, control descent. pt doesnt tend to reach back for armrests unless cued. Pt tends to pull self up on walker.  Ambulation/Gait Ambulation/Gait assistance: Min assist Ambulation Distance (Feet): 22 Feet Assistive device: Rolling walker (2 wheeled) Gait Pattern/deviations: Trunk flexed;Step-to pattern;Step-through pattern;Narrow base of support Gait velocity: decreased   General Gait Details: very slow gait speed. R leg tends to lag behind. Distance limited by pain in knees, fatigue. Assist to stabilize thorughout ambulation and to maneuver with RW.    Stairs            Wheelchair Mobility    Modified Rankin (Stroke Patients Only)       Balance                                     Cognition Arousal/Alertness: Awake/alert Behavior During Therapy: WFL for tasks assessed/performed Overall Cognitive Status: Within Functional Limits for tasks assessed                      Exercises      General Comments        Pertinent Vitals/Pain     Home Living                      Prior Function            PT Goals (current goals can now be found in the care plan section) Progress towards PT goals: Progressing toward goals    Frequency  Min 3X/week    PT Plan      Co-evaluation             End of Session Equipment Utilized During Treatment: Gait belt Activity Tolerance: Patient limited by pain;Patient limited by fatigue Patient left: in bed;with call bell/phone within reach     Time: 1510-1527 PT Time Calculation (min): 17 min  Charges:  $Gait Training: 8-22 mins                    G Codes:      Felecia Shelling  PTA [facility]  Acute  Rehab Pager  [phone] 

## 2013-06-18 LAB — BASIC METABOLIC PANEL
BUN: 20 mg/dL (ref 6–23)
CO2: 26 meq/L (ref 19–32)
Calcium: 9 mg/dL (ref 8.4–10.5)
Chloride: 98 mEq/L (ref 96–112)
Creatinine, Ser: 1.26 mg/dL (ref 0.50–1.35)
GFR calc Af Amer: 55 mL/min — ABNORMAL LOW (ref >90)
GFR calc non Af Amer: 47 mL/min — ABNORMAL LOW (ref >90)
GLUCOSE: 98 mg/dL (ref 70–99)
Potassium: 4.1 mEq/L (ref 3.7–5.3)
Sodium: 134 mEq/L — ABNORMAL LOW (ref 137–147)

## 2013-06-18 MED ORDER — ACETAMINOPHEN 325 MG PO TABS: 650.0000 mg | ORAL_TABLET | Freq: Four times a day (QID) | ORAL | Status: DC | PRN

## 2013-06-18 MED ORDER — FUROSEMIDE 20 MG PO TABS
20.0000 mg | ORAL_TABLET | Freq: Every day | ORAL | Status: DC
  Administered 2013-06-18: 20 mg via ORAL
  Filled 2013-06-18: qty 1

## 2013-06-18 MED ORDER — ASPIRIN 81 MG PO TBEC: 81.0000 mg | DELAYED_RELEASE_TABLET | Freq: Every day | ORAL | Status: DC

## 2013-06-18 MED ORDER — FUROSEMIDE 20 MG PO TABS: 20.0000 mg | ORAL_TABLET | Freq: Every day | ORAL | Status: DC

## 2013-06-18 NOTE — Progress Notes (Signed)
CARE MANAGEMENT NOTE 06/18/2013  Patient:  Derek Simpson, Derek Simpson   Account Number:  [id number]  Date Initiated:  06/15/2013  Documentation initiated by:  [facility]  Subjective/Objective Assessment:   80 Y/O M ADMITTED W/CHF.     Action/Plan:   FROM HOME W/GOOD FAMILY SUPPORT.HAS PCP,PHARMACY.   Anticipated DC Date:  06/20/2013   Anticipated DC Plan:  HOME W HOME HEALTH SERVICES      DC Planning Services  CM consult      [facility] Choice  HOME HEALTH   Choice offered to / List presented to:  C-4 Adult Children        HH arranged  HH-2 PT  HH-3 OT      [facility] agency  [facility]   Status of service:  Completed, signed off Medicare Important Message given?   (If response is "NO", the following Medicare IM given date fields will be blank) Date Medicare IM given:   Date Additional Medicare IM given:    Discharge Disposition:  HOME W HOME HEALTH SERVICES  Per UR Regulation:  Reviewed for med. necessity/level of care/duration of stay  If discussed at Long Length of Stay Meetings, dates discussed:    Comments:  06/18/2013 1000 Spoke to pt and gave permission to speak with dtrs. NCM spoke to dtr, Derek Simpson. Dtr states contact for pt is Derek Simpson, # [phone]. Offered choice for [facility], dtr requested [facility] for [facility]. Pt has RW and 3n1 at home. Faxed request for [facility] to [facility]. Isidoro Donning RN CCM Case Mgmt phone [phone]  06/16/13 KATHY MAHABIR RN,BSN NCM [phone] FAMILY DECLINES HHC.GRAND DAUGHTER PROVIDES ASST W/ADL'S.   06/15/13 KATHY MAHABIR RN,BSN NCM [phone] WOULD RECOMMEND PT CONS.

## 2013-06-18 NOTE — Progress Notes (Signed)
Patient ID: Derek Simpson, male   DOB: Aug 02, 1918, 80 y.o.   MRN: [id number]  See Dr. Norris Cross cardiology consult note from yesterday.  Jerral Bonito, MD

## 2013-06-18 NOTE — Discharge Summary (Signed)
Physician Discharge Summary  Derek Simpson WUJ:[id number] DOB: [birth year] DOA: 06/15/2013  PCP: Laurena Slimmer, MD  Admit date: 06/15/2013 Discharge date: 06/18/2013  Time spent: 45 minutes  Recommendations for Outpatient Follow-up:  1. Dr.Turner in 2 weeks 2. Please add low dose beta-blocker and ACE outpatient as BP tolerates  Discharge Diagnoses:  Principal Problem:   Acute systolic CHF (congestive heart failure), NYHA class 4 Active Problems:   Acute respiratory failure with hypoxia   Osteoarthritis of both knees   Abnormal EKG   DCM (dilated cardiomyopathy)   Discharge Condition: stable  Diet recommendation: low sodium  Filed Weights   06/16/13 0420 06/17/13 0602 06/18/13 0531  Weight: 74.345 kg (163 lb 14.4 oz) 72.712 kg (160 lb 4.8 oz) 72.576 kg (160 lb)    History of present illness:  Patient is a 80 year old man with past medical history only significant for bilateral knee osteoarthritis for which he takes naproxen. Is followed at the [facility]. He presents today with about a 3 to four-day history of progressive shortness of breath. It has worsened to the point where he has difficulty lying down flat at night and has had to increase the amount of pillows that he uses. He also noticed today some substernal chest pressure that did not radiate. He has also had some progressive lower extremity edema. Because of all of these issues his children decided to bring him into the hospital today for evaluation. He is found to have a chest x-ray consistent with pulmonary edema, 2-3+ bilateral lower extremity pitting edema, a BNP of greater than 7000. Hospitalist admission has been requested  Hospital Course:  Acute Systolic CHF  -new diagnosis -Chest x-ray consistent with pulmonary edema, BNP greater than 7000.  -Diuresed with IV lasix with good Clinical improvement -2D ECHO with EF of 15-20% diffuse hypokinesis  -BP on lower side, to tolerate B blocker or ACE for now -will need to add these  meds as BP tolerates -Cardiology consult per dr.Turner appreciated, she discussed options of med mgt vs stress test and if abnormal LHC but pt and family at this time preferred a more conservative approach -clinically euvolemic at this time, net 10L negative this admission -will be discharged home on low dose PO lasix -educated daughters about diet/weight monitoring etc -Fu with Dr.Turner in 2 weeks  -advised to stop NSAIDs   OA  -avoid NSAIDs due to 1  -tylenol PRN   Procedures:  EF: diffuse hypokinesis, EF 15%  Consultations:  Cards  Discharge Exam: Filed Vitals:   06/18/13 0531  BP: 99/63  Pulse: 83  Temp: 98.1 F (36.7 C)  Resp: 18    General: AAOx3 Cardiovascular: S1S2/RRR Respiratory: CTAB  Discharge Instructions You were cared for by a hospitalist during your hospital stay. If you have any questions about your discharge medications or the care you received while you were in the hospital after you are discharged, you can call the unit and asked to speak with the hospitalist on call if the hospitalist that took care of you is not available. Once you are discharged, your primary care physician will handle any further medical issues. Please note that NO REFILLS for any discharge medications will be authorized once you are discharged, as it is imperative that you return to your primary care physician (or establish a relationship with a primary care physician if you do not have one) for your aftercare needs so that they can reassess your need for medications and monitor your lab values.  Discharge Orders  Future Orders Complete By Expires   Diet - low sodium heart healthy  As directed    Increase activity slowly  As directed        Medication List    STOP taking these medications       naproxen 500 MG tablet  Commonly known as:  NAPROSYN      TAKE these medications       acetaminophen 325 MG tablet  Commonly known as:  TYLENOL  Take 2 tablets (650 mg total)  by mouth every 6 (six) hours as needed for mild pain, fever or headache.     aspirin 81 MG EC tablet  Take 1 tablet (81 mg total) by mouth daily.     furosemide 20 MG tablet  Commonly known as:  LASIX  Take 1 tablet (20 mg total) by mouth daily.       No Known Allergies     Follow-up Information   Follow up with Quintella Reichert, MD. Schedule an appointment as soon as possible for a visit in 2 weeks.   Specialty:  Cardiology   Contact information:   [address] [place] [address] [phone]        The results of significant diagnostics from this hospitalization (including imaging, microbiology, ancillary and laboratory) are listed below for reference.    Significant Diagnostic Studies: Dg Chest 2 View  06/15/2013   CLINICAL DATA:  80 year old male cough, wheezing, shortness of Breath. Initial encounter.  EXAM: CHEST  2 VIEW  COMPARISON:  None.  FINDINGS: Semi upright AP and lateral views of the chest. Cardiomegaly. Evidence of moderate pleural effusions. Patchy and confluent bibasilar opacity. Upper lobes appear more clear. No pneumothorax. No acute osseous abnormality identified.  IMPRESSION: Cardiomegaly with moderate pleural effusions. Patchy bibasilar opacity could reflect basilar predominant pulmonary edema or less likely pneumonia.   Electronically Signed   By: Augusto Gamble M.D.   On: 06/15/2013 09:09    Microbiology: No results found for this or any previous visit (from the past 240 hour(s)).   Labs: Basic Metabolic Panel:  Recent Labs Lab 06/15/13 0915 06/15/13 1632 06/15/13 2230 06/16/13 0316 06/17/13 0447 06/18/13 0509  NA 137  --   --  141 138 134*  K 4.5  --  4.0 4.1 4.2 4.1  CL 104  --   --  104 100 98  CO2 21  --   --  25 26 26   GLUCOSE 107*  --   --  93 94 98  BUN 18  --   --  22 18 20   CREATININE 1.30 1.38*  --  1.42* 1.32 1.26  CALCIUM 9.1  --   --  9.2 9.2 9.0   Liver Function Tests:  Recent Labs Lab 06/15/13 0915  AST 20   ALT 19  ALKPHOS 52  BILITOT 0.7  PROT 6.6  ALBUMIN 3.4*   No results found for this basename: LIPASE, AMYLASE,  in the last 168 hours No results found for this basename: AMMONIA,  in the last 168 hours CBC:  Recent Labs Lab 06/15/13 0915 06/15/13 1632  WBC 7.7 8.6  NEUTROABS 5.3  --   HGB 14.6 14.5  HCT 40.6 40.4  MCV 78.1 78.6  PLT 150 153   Cardiac Enzymes:  Recent Labs Lab 06/15/13 0915 06/15/13 1632 06/15/13 2230  TROPONINI <0.30 <0.30 <0.30   BNP: BNP (last 3 results)  Recent Labs  06/15/13 0915  PROBNP 7124.0*   CBG: No  results found for this basename: GLUCAP,  in the last 168 hours     Signed:  Zannie Covereetha Braelyn Simpson  [facility] 06/18/2013, 9:41 AM

## 2013-07-05 ENCOUNTER — Encounter: Payer: Self-pay | Admitting: Cardiology

## 2013-07-05 ENCOUNTER — Ambulatory Visit (INDEPENDENT_AMBULATORY_CARE_PROVIDER_SITE_OTHER): Payer: Medicare Other | Admitting: Cardiology

## 2013-07-05 VITALS — BP 110/80 | HR 88 | Ht 74.0 in | Wt 135.8 lb

## 2013-07-05 DIAGNOSIS — I4949 Other premature depolarization: Secondary | ICD-10-CM

## 2013-07-05 DIAGNOSIS — I493 Ventricular premature depolarization: Secondary | ICD-10-CM | POA: Insufficient documentation

## 2013-07-05 DIAGNOSIS — I509 Heart failure, unspecified: Secondary | ICD-10-CM

## 2013-07-05 DIAGNOSIS — I428 Other cardiomyopathies: Secondary | ICD-10-CM

## 2013-07-05 DIAGNOSIS — I420 Dilated cardiomyopathy: Secondary | ICD-10-CM

## 2013-07-05 DIAGNOSIS — I5022 Chronic systolic (congestive) heart failure: Secondary | ICD-10-CM

## 2013-07-05 LAB — BASIC METABOLIC PANEL
BUN: 19 mg/dL (ref 6–23)
CHLORIDE: 107 meq/L (ref 96–112)
CO2: 26 meq/L (ref 19–32)
Calcium: 9.1 mg/dL (ref 8.4–10.5)
Creatinine, Ser: 1.2 mg/dL (ref 0.4–1.5)
GFR: 71.92 mL/min (ref >60.00)
GLUCOSE: 96 mg/dL (ref 70–99)
POTASSIUM: 4 meq/L (ref 3.5–5.1)
Sodium: 139 mEq/L (ref 135–145)

## 2013-07-05 MED ORDER — CARVEDILOL 3.125 MG PO TABS
3.1250 mg | ORAL_TABLET | Freq: Two times a day (BID) | ORAL | Status: DC
Start: 2013-07-05 — End: 2013-07-09

## 2013-07-05 NOTE — Patient Instructions (Signed)
STAR CARVEDILOL 3.125 MG TWICE A DAY  Will obtain labs today and call you with the results (BMET)  Your physician recommends that you schedule a follow-up appointment in: 3 MONTH [facility]  CHECK YOUR BLOOD PRESSURE DAILY EVERY MORNING AND CALL WITH READINGS IN 1 WEEK, ASK FOR DANIELLE

## 2013-07-05 NOTE — Progress Notes (Signed)
  [address] [place], [place]  [address] Phone: [phone] Fax:  [phone]  Date:  07/05/2013   ID:  Derek Simpson, DOB [birth year], MRN [id number]  PCP:  Laurena Slimmer, MD  Cardiologist:  Armanda Magic, MD     History of Present Illness: Patient is a 80 year old man who recently presented to [facility] with about a 3 to four-day history of progressive shortness of breath. It had worsened to the point where he had difficulty lying down flat at night and had to increase the amount of pillows that he uses. He also noticed on day of admission some substernal chest pressure that did not radiate. He has also had some progressive lower extremity edema. Because of all of these issues his children decided to bring him into the hospital today for evaluation. He is found to have a chest x-ray consistent with pulmonary edema, 2-3+ bilateral lower extremity pitting edema, a BNP of greater than 7000. Marland Kitchen BNP was elevated at 7124 and cardiac markers are normal. 2D echo done showed severe LV dysfunction with EF 15-20% with diffuse hypokinesis. He was given IV lasix with good diuresis. His family and patient did not want to pursue any invasive cardiac workup.  He is doing well.  He denies any chest pain. He has chronic LE edema and DOE but they are much improved from when he was in the hospital. His weight has been consistently at 163lbs.     Wt Readings from Last 3 Encounters:  06/18/13 160 lb (72.576 kg)     Past Medical History  Diagnosis Date  . Arthritis   . PVC (premature ventricular contraction)     Current Outpatient Prescriptions  Medication Sig Dispense Refill  . acetaminophen (TYLENOL) 325 MG tablet Take 2 tablets (650 mg total) by mouth every 6 (six) hours as needed for mild pain, fever or headache.      Marland Kitchen aspirin EC 81 MG EC tablet Take 1 tablet (81 mg total) by mouth daily.      . furosemide (LASIX) 20 MG tablet Take 1 tablet (20 mg total) by mouth daily.  30 tablet  0   No  current facility-administered medications for this visit.    Allergies:   No Known Allergies  Social History:  The patient  reports that he has never smoked. He has never used smokeless tobacco. He reports that he does not drink alcohol or use illicit drugs.   Family History:  The patient's family history is not on file.   ROS:  Please see the history of present illness.      All other systems reviewed and negative.   PHYSICAL EXAM: VS:  BP 110/80  Pulse 88 Well nourished, well developed, in no acute distress HEENT: normal Neck: no JVD Cardiac:  normal S1, S2; RRR; no murmur Lungs:  clear to auscultation bilaterally, no wheezing, rhonchi or rales Abd: soft, nontender, no hepatomegaly Ext: trace edema Skin: warm and dry Neuro:  CNs 2-12 intact, no focal abnormalities noted       ASSESSMENT AND PLAN:  1. Chronic systolic CHF - continue Lasix - start Coreg 3.125mg  BID - check BP daily for a week and call with results - check BMET 2. DCM EF 15-20% 3.   PVC's - asymptomatic  Followup with me in 3 months  Signed, Armanda Magic, MD 07/05/2013 8:24 AM

## 2013-07-07 ENCOUNTER — Telehealth: Payer: Self-pay | Admitting: Cardiology

## 2013-07-07 NOTE — Telephone Encounter (Signed)
Returned daughter's call.

## 2013-07-07 NOTE — Telephone Encounter (Signed)
New message ° ° ° ° °Returned Amy's call °

## 2013-07-09 ENCOUNTER — Emergency Department (HOSPITAL_COMMUNITY): Payer: Medicare Other

## 2013-07-09 ENCOUNTER — Emergency Department (HOSPITAL_COMMUNITY)
Admission: EM | Admit: 2013-07-09 | Discharge: 2013-07-09 | Disposition: A | Discharge status: Home / Self Care | Payer: Medicare Other | Attending: Emergency Medicine | Admitting: Emergency Medicine

## 2013-07-09 ENCOUNTER — Encounter (HOSPITAL_COMMUNITY): Payer: Self-pay | Admitting: Emergency Medicine

## 2013-07-09 DIAGNOSIS — R059 Cough, unspecified: Secondary | ICD-10-CM | POA: Insufficient documentation

## 2013-07-09 DIAGNOSIS — R05 Cough: Secondary | ICD-10-CM | POA: Insufficient documentation

## 2013-07-09 DIAGNOSIS — Z8739 Personal history of other diseases of the musculoskeletal system and connective tissue: Secondary | ICD-10-CM | POA: Insufficient documentation

## 2013-07-09 DIAGNOSIS — R0600 Dyspnea, unspecified: Secondary | ICD-10-CM

## 2013-07-09 DIAGNOSIS — I4949 Other premature depolarization: Secondary | ICD-10-CM | POA: Insufficient documentation

## 2013-07-09 DIAGNOSIS — I5022 Chronic systolic (congestive) heart failure: Secondary | ICD-10-CM

## 2013-07-09 DIAGNOSIS — Z79899 Other long term (current) drug therapy: Secondary | ICD-10-CM | POA: Insufficient documentation

## 2013-07-09 LAB — BASIC METABOLIC PANEL
BUN: 27 mg/dL — AB (ref 6–23)
CO2: 21 mEq/L (ref 19–32)
CREATININE: 1.24 mg/dL (ref 0.50–1.35)
Calcium: 9.2 mg/dL (ref 8.4–10.5)
Chloride: 104 mEq/L (ref 96–112)
GFR calc Af Amer: 56 mL/min — ABNORMAL LOW (ref >90)
GFR, EST NON AFRICAN AMERICAN: 48 mL/min — AB (ref >90)
GLUCOSE: 102 mg/dL — AB (ref 70–99)
POTASSIUM: 4.4 meq/L (ref 3.7–5.3)
Sodium: 138 mEq/L (ref 137–147)

## 2013-07-09 LAB — CBC
HEMATOCRIT: 40 % (ref 39.0–52.0)
Hemoglobin: 14.4 g/dL (ref 13.0–17.0)
MCH: 27.6 pg (ref 26.0–34.0)
MCHC: 36 g/dL (ref 30.0–36.0)
MCV: 76.8 fL — AB (ref 78.0–100.0)
Platelets: 182 10*3/uL (ref 150–400)
RBC: 5.21 MIL/uL (ref 4.22–5.81)
RDW: 15.5 % (ref 11.5–15.5)
WBC: 11.7 10*3/uL — ABNORMAL HIGH (ref 4.0–10.5)

## 2013-07-09 LAB — I-STAT TROPONIN, ED: Troponin i, poc: 0 ng/mL (ref 0.00–0.08)

## 2013-07-09 LAB — PRO B NATRIURETIC PEPTIDE: Pro B Natriuretic peptide (BNP): 10159 pg/mL — ABNORMAL HIGH (ref 0–450)

## 2013-07-09 MED ORDER — FUROSEMIDE 10 MG/ML IJ SOLN
40.0000 mg | Freq: Once | INTRAMUSCULAR | Status: AC
  Administered 2013-07-09: 40 mg via INTRAVENOUS
  Filled 2013-07-09: qty 4

## 2013-07-09 MED ORDER — POTASSIUM CHLORIDE CRYS ER 20 MEQ PO TBCR
20.0000 meq | EXTENDED_RELEASE_TABLET | Freq: Once | ORAL | Status: AC
  Administered 2013-07-09: 20 meq via ORAL
  Filled 2013-07-09: qty 1

## 2013-07-09 MED ORDER — FUROSEMIDE 20 MG PO TABS: 20.0000 mg | ORAL_TABLET | Freq: Two times a day (BID) | ORAL | Status: DC

## 2013-07-09 NOTE — ED Notes (Signed)
Pt reports pressure to chest but reports "it is not pain." Pt still rates same as arrival.

## 2013-07-09 NOTE — Discharge Instructions (Signed)
Increase your lasix (furosemide) to twice daily for the next 5 days.   Shortness of Breath Shortness of breath means you have trouble breathing. Shortness of breath may indicate that you have a medical problem. You should seek immediate medical care for shortness of breath. CAUSES   Not enough oxygen in the air (as with high altitudes or a smoke-filled room).  Short-term (acute) lung disease, including:  Infections, such as pneumonia.  Fluid in the lungs, such as heart failure.  A blood clot in the lungs (pulmonary embolism).  Long-term (chronic) lung diseases.  Heart disease (heart attack, angina, heart failure, and others).  Low red blood cells (anemia).  Poor physical fitness. This can cause shortness of breath when you exercise.  Chest or back injuries or stiffness.  Being overweight.  Smoking.  Anxiety. This can make you feel like you are not getting enough air. DIAGNOSIS  Serious medical problems can usually be found during your physical exam. Tests may also be done to determine why you are having shortness of breath. Tests may include:  Chest X-rays.  Lung function tests.  Blood tests.  Electrocardiography.  Exercise testing.  Echocardiography.  Imaging scans. Your caregiver may not be able to find a cause for your shortness of breath after your exam. In this case, it is important to have a follow-up exam with your caregiver as directed.  TREATMENT  Treatment for shortness of breath depends on the cause of your symptoms and can vary greatly. HOME CARE INSTRUCTIONS   Do not smoke. Smoking is a common cause of shortness of breath. If you smoke, ask for help to quit.  Avoid being around chemicals or things that may bother your breathing, such as paint fumes and dust.  Rest as needed. Slowly resume your usual activities.  If medicines were prescribed, take them as directed for the full length of time directed. This includes oxygen and any inhaled  medicines.  Keep all follow-up appointments as directed by your caregiver. SEEK MEDICAL CARE IF:   Your condition does not improve in the time expected.  You have a hard time doing your normal activities even with rest.  You have any side effects or problems with the medicines prescribed.  You develop any new symptoms. SEEK IMMEDIATE MEDICAL CARE IF:   Your shortness of breath gets worse.  You feel lightheaded, faint, or develop a cough not controlled with medicines.  You start coughing up blood.  You have pain with breathing.  You have chest pain or pain in your arms, shoulders, or abdomen.  You have a fever.  You are unable to walk up stairs or exercise the way you normally do. MAKE SURE YOU:  Understand these instructions.  Will watch your condition.  Will get help right away if you are not doing well or get worse. Document Released: 10/22/2000 Document Revised: 07/29/2011 Document Reviewed: 04/14/2011 [facility] Patient Information 2014 [place], [place].

## 2013-07-09 NOTE — ED Notes (Signed)
Pt reports hx of CHF with productive cough. Pt reports onset of SOB last night with left sided CP.

## 2013-07-09 NOTE — ED Notes (Signed)
He c/o shortness of breath with frequent cough x 4 days.  He also points at left upper chest area and states "And I have some 'pressure' here too".  His skin is normal, warm and dry and he is mild-mod. Short of breath.

## 2013-07-14 NOTE — ED Provider Notes (Signed)
CSN: [id number]     Arrival date & time 07/09/13  4827 History   First MD Initiated Contact with Patient 07/09/13 0912     Chief Complaint  Patient presents with  . Shortness of Breath     (Consider location/radiation/quality/duration/timing/severity/associated sxs/prior Treatment) HPI  80 year old male with dyspnea. Worsening opacity 4 days. Associated with cough. Course when laying back. No fevers or chills. No unusual swelling. Does endorse some mild chest pressure. Reports compliance with his medications.  Past Medical History  Diagnosis Date  . Arthritis   . PVC (premature ventricular contraction)    No past surgical history on file. No family history on file. History  Substance Use Topics  . Smoking status: Never Smoker   . Smokeless tobacco: Never Used  . Alcohol Use: No    Review of Systems  All systems reviewed and negative, other than as noted in HPI.   Allergies  Review of patient's allergies indicates no known allergies.  Home Medications   Prior to Admission medications   Medication Sig Start Date End Date Taking? Authorizing Provider  carvedilol (COREG) 3.125 MG tablet Take 3.125 mg by mouth 2 (two) times daily with a meal.   Yes Historical Provider, MD  furosemide (LASIX) 20 MG tablet Take 20 mg by mouth every morning.   Yes Historical Provider, MD  furosemide (LASIX) 20 MG tablet Take 1 tablet (20 mg total) by mouth 2 (two) times daily. 07/09/13   Raeford Razor, MD   BP 111/67  Pulse 85  Temp(Src) 97.3 F (36.3 C) (Oral)  Resp 24  SpO2 95% Physical Exam  Nursing note and vitals reviewed. Constitutional: He appears well-developed and well-nourished. No distress.  HENT:  Head: Normocephalic and atraumatic.  Eyes: Conjunctivae are normal. Right eye exhibits no discharge. Left eye exhibits no discharge.  Neck: Neck supple.  Cardiovascular: Normal rate, regular rhythm and normal heart sounds.  Exam reveals no gallop and no friction rub.   No murmur  heard. Pulmonary/Chest: Effort normal and breath sounds normal. No respiratory distress.  Abdominal: Soft. He exhibits no distension. There is no tenderness.  Musculoskeletal: He exhibits edema. He exhibits no tenderness.  Neurological: He is alert.  Skin: Skin is warm and dry.  Psychiatric: He has a normal mood and affect. His behavior is normal. Thought content normal.      ED Course  Procedures (including critical care time) Labs Review Labs Reviewed  CBC - Abnormal; Notable for the following:    WBC 11.7 (*)    MCV 76.8 (*)    All other components within normal limits  BASIC METABOLIC PANEL - Abnormal; Notable for the following:    Glucose, Bld 102 (*)    BUN 27 (*)    GFR calc non Af Amer 48 (*)    GFR calc Af Amer 56 (*)    All other components within normal limits  PRO B NATRIURETIC PEPTIDE - Abnormal; Notable for the following:    Pro B Natriuretic peptide (BNP) 10159.0 (*)    All other components within normal limits  I-STAT TROPOININ, ED    Imaging Review No results found.  Dg Chest 2 View  07/09/2013   CLINICAL DATA:  Shortness of breath  EXAM: CHEST  2 VIEW  COMPARISON:  06/15/2013  FINDINGS: Cardiac shadow remains mildly enlarged. Small bilateral pleural effusions are seen but improved from the prior exam. Mild bibasilar changes are seen but also improved from the prior study.  IMPRESSION: Bilateral pleural effusions which have  improved from the prior exam. Bibasilar parenchymal change improved from the prior study.   Electronically Signed   By: Alcide CleverMark  Lukens M.D.   On: 07/09/2013 10:20    EKG Interpretation   Date/Time:  Saturday Jul 09 2013 09:11:28 EDT Ventricular Rate:  94 PR Interval:  243 QRS Duration: 122 QT Interval:  399 QTC Calculation: 499 R Axis:   -29 Text Interpretation:  Sinus rhythm Paired ventricular premature complexes  Prolonged PR interval LVH with secondary repolarization abnormality  Minimal ST elevation, inferior leads ED PHYSICIAN  INTERPRETATION AVAILABLE  IN [facility] Confirmed by TEST, Record ([id number]) on 07/11/2013 7:29:11  AM      MDM   Final diagnoses:  Dyspnea  Chronic systolic CHF (congestive heart failure)    80 year old male with dyspnea. Suspect exacerbation of heart failure. Oxygen saturations are pretty good on room air. Patient was recently admitted for heart failure. Seen by cardiology. Discussed possible stress testing and catheterization indicated. Family elected for more conservative management. Patient appears well. Feel he can be discharged at this point. Given a dose of diuretic in the emergency room. Will have patient increase his home Lasix for the next 2 days. Return precautions were discussed.    Raeford RazorStephen Kinsler Soeder, MD 07/14/13 [phone]

## 2013-07-18 ENCOUNTER — Telehealth: Payer: Self-pay | Admitting: Cardiology

## 2013-07-18 NOTE — Telephone Encounter (Signed)
**Note De-identified Michaeljoseph Revolorio Obfuscation** LMTCB

## 2013-07-18 NOTE — Telephone Encounter (Signed)
New message    Patient daughter calling with C/O father blood pressure on 6/6 Saturday   67/36 , heart rate 66.  No chest pain, no sob.    Daughter stated this has been low since last week.

## 2013-07-25 ENCOUNTER — Telehealth: Payer: Self-pay | Admitting: General Surgery

## 2013-07-25 NOTE — Telephone Encounter (Signed)
Pts daughter stated they see a Dr at the [facility] and that he has an appt this month. She stated she will call us once they check the BP readings and they will hold the Coreg as requested by Dr Mayford Knife until then.

## 2013-07-25 NOTE — Telephone Encounter (Signed)
LVM for pt's daughter to return call

## 2013-07-25 NOTE — Telephone Encounter (Signed)
Please get him in to see his PCP today.  Since he is asymptomatic I suspect the BP readings are inaccurate

## 2013-07-25 NOTE — Telephone Encounter (Signed)
Patient daughter calling with C/O father blood pressure on 6/6 Saturday 67/36 , heart rate 66. No chest pain, no sob.  Daughter stated this has been low since last week.    Pts daughter stated the whole week its been low.  Thursday may 28th 103/51 P 89 June 1st 72/46 p 76 73/48 P 64 76/44 P 65 Weds June 3rd Left arm 48/22 P 68 Right arm 85/35 P 50 June 4th Left 72/27 p 73 right 82/51 P 69 6/6 Saturday 67/36 , heart rate 66  Pt does not have any side effect or SOB, CP, or Dizziness.  Pts meds are correct. I went over the list with pts daughter.  Best number for daughter [phone]

## 2013-09-27 ENCOUNTER — Other Ambulatory Visit: Payer: Self-pay

## 2013-09-27 MED ORDER — FUROSEMIDE 20 MG PO TABS: 20.0000 mg | ORAL_TABLET | Freq: Every morning | ORAL | Status: DC

## 2013-10-06 ENCOUNTER — Ambulatory Visit (INDEPENDENT_AMBULATORY_CARE_PROVIDER_SITE_OTHER): Payer: Medicare Other | Admitting: Cardiology

## 2013-10-06 ENCOUNTER — Telehealth: Payer: Self-pay | Admitting: Cardiology

## 2013-10-06 ENCOUNTER — Encounter: Payer: Self-pay | Admitting: Cardiology

## 2013-10-06 VITALS — BP 122/82 | HR 74 | Ht 74.0 in | Wt 174.8 lb

## 2013-10-06 DIAGNOSIS — I5022 Chronic systolic (congestive) heart failure: Secondary | ICD-10-CM

## 2013-10-06 DIAGNOSIS — I493 Ventricular premature depolarization: Secondary | ICD-10-CM

## 2013-10-06 DIAGNOSIS — I428 Other cardiomyopathies: Secondary | ICD-10-CM

## 2013-10-06 DIAGNOSIS — I4949 Other premature depolarization: Secondary | ICD-10-CM

## 2013-10-06 DIAGNOSIS — I509 Heart failure, unspecified: Secondary | ICD-10-CM

## 2013-10-06 DIAGNOSIS — I420 Dilated cardiomyopathy: Secondary | ICD-10-CM

## 2013-10-06 DIAGNOSIS — R0602 Shortness of breath: Secondary | ICD-10-CM | POA: Insufficient documentation

## 2013-10-06 LAB — BASIC METABOLIC PANEL
BUN: 20 mg/dL (ref 6–23)
CO2: 27 mEq/L (ref 19–32)
Calcium: 9.4 mg/dL (ref 8.4–10.5)
Chloride: 105 mEq/L (ref 96–112)
Creatinine, Ser: 1.4 mg/dL (ref 0.4–1.5)
GFR: 60.74 mL/min (ref >60.00)
Glucose, Bld: 90 mg/dL (ref 70–99)
Potassium: 4.2 mEq/L (ref 3.5–5.1)
Sodium: 139 mEq/L (ref 135–145)

## 2013-10-06 LAB — BRAIN NATRIURETIC PEPTIDE: Pro B Natriuretic peptide (BNP): 1239 pg/mL — ABNORMAL HIGH (ref 0.0–100.0)

## 2013-10-06 NOTE — Progress Notes (Signed)
[address] [place], [place]  [address] Phone: [phone] Fax:  [phone]  Date:  10/06/2013   ID:  Derek Simpson, DOB 10-23-1918, MRN [id number]  PCP:  Laurena Slimmer, MD  Cardiologist:  Armanda Magic, M   History of Present Illness: Patient is a 80 year old man who recently presented to [facility] with about a 3 to four-day history of progressive shortness of breath. It had worsened to the point where he had difficulty lying down flat at night and had to increase the amount of pillows that he uses. He also noticed on day of admission some substernal chest pressure that did not radiate. He has also had some progressive lower extremity edema. Because of all of these issues his children decided to bring him into the hospital today for evaluation. He is found to have a chest x-ray consistent with pulmonary edema, 2-3+ bilateral lower extremity pitting edema, a BNP of greater than 7000. Marland Kitchen BNP was elevated at 7124 and cardiac markers are normal. 2D echo done showed severe LV dysfunction with EF 15-20% with diffuse hypokinesis. He was given IV lasix with good diuresis. His family and patient did not want to pursue any invasive cardiac workup. He is doing well. He denies any chest pain. He has chronic LE edema and DOE but they are much improved from when he was in the hospital.      Wt Readings from Last 3 Encounters:  10/06/13 174 lb 12.8 oz (79.289 kg)  07/05/13 135 lb 12.8 oz (61.598 kg)  06/18/13 160 lb (72.576 kg)     Past Medical History  Diagnosis Date  . Arthritis   . PVC (premature ventricular contraction)     Current Outpatient Prescriptions  Medication Sig Dispense Refill  . carvedilol (COREG) 3.125 MG tablet Take 3.125 mg by mouth 2 (two) times daily with a meal.      . furosemide (LASIX) 20 MG tablet Take 1 tablet (20 mg total) by mouth every morning.  30 tablet  1   No current facility-administered medications for this visit.    Allergies:   No Known  Allergies  Social History:  The patient  reports that he has never smoked. He has never used smokeless tobacco. He reports that he does not drink alcohol or use illicit drugs.   Family History:  The patient's family history is not on file.   ROS:  Please see the history of present illness.      All other systems reviewed and negative.   PHYSICAL EXAM: VS:  BP 122/82  Pulse 74  Ht 6\' 2"  (1.88 m)  Wt 174 lb 12.8 oz (79.289 kg)  BMI 22.43 kg/m2  SpO2 99% Well nourished, well developed, in no acute distress HEENT: normal Neck: no JVD Cardiac:  normal S1, S2; RRR; no murmur Lungs:  clear to auscultation bilaterally, no wheezing, rhonchi or rales Abd: soft, nontender, no hepatomegaly Ext: trace edema Skin: warm and dry Neuro:  CNs 2-12 intact, no focal abnormalities noted  EKG:  NSR with PVC's and nonspecific T wave abnormality      ASSESSMENT AND PLAN:  1.  Chronic systolic CHF- he still has chronic DOE and his weight is up some today.  He says that he has started eating better.   - continue Lasix/Coreg       2.  DCM EF 15-20%       3. PVC's - asymptomatic        4.  Chronic SOB  secondary to #1 - I will check a BNP/BMET since he has gained some weight to determined if it is water weight or from him eating better  Followu with me in 3 months   Signed, Armanda Magic, MD 10/06/2013 8:31 AM

## 2013-10-06 NOTE — Telephone Encounter (Signed)
A BNP was ordered today at [facility]

## 2013-10-06 NOTE — Patient Instructions (Signed)
Your physician recommends that you continue on your current medications as directed. Please refer to the Current Medication list given to you today.  Your physician recommends that you go to the lab today for a BNP and BMET  Your physician recommends that you schedule a follow-up appointment in: 3 months with Dr Mayford Knife

## 2013-10-06 NOTE — Telephone Encounter (Signed)
TO Dr Turner to advise.  

## 2013-10-06 NOTE — Telephone Encounter (Signed)
New message    Patient was seen today forgot that to let MD know he's having sob on/ off for last 3 months.     Daughter states he not having sob now.

## 2013-10-07 ENCOUNTER — Other Ambulatory Visit: Payer: Self-pay | Admitting: General Surgery

## 2013-10-07 DIAGNOSIS — R0602 Shortness of breath: Secondary | ICD-10-CM

## 2013-10-07 NOTE — Telephone Encounter (Signed)
Pt is aware.  

## 2013-10-14 ENCOUNTER — Other Ambulatory Visit (INDEPENDENT_AMBULATORY_CARE_PROVIDER_SITE_OTHER): Payer: Medicare Other

## 2013-10-14 DIAGNOSIS — R0602 Shortness of breath: Secondary | ICD-10-CM

## 2013-10-14 LAB — BASIC METABOLIC PANEL
BUN: 25 mg/dL — ABNORMAL HIGH (ref 6–23)
CALCIUM: 9.3 mg/dL (ref 8.4–10.5)
CO2: 25 mEq/L (ref 19–32)
Chloride: 106 mEq/L (ref 96–112)
Creatinine, Ser: 1.4 mg/dL (ref 0.4–1.5)
GFR: 60.24 mL/min (ref >60.00)
Glucose, Bld: 112 mg/dL — ABNORMAL HIGH (ref 70–99)
Potassium: 4 mEq/L (ref 3.5–5.1)
Sodium: 138 mEq/L (ref 135–145)

## 2013-10-14 LAB — BRAIN NATRIURETIC PEPTIDE: PRO B NATRI PEPTIDE: 1385 pg/mL — AB (ref 0.0–100.0)

## 2013-10-18 ENCOUNTER — Other Ambulatory Visit: Payer: Self-pay | Admitting: General Surgery

## 2013-10-18 DIAGNOSIS — Z79899 Other long term (current) drug therapy: Secondary | ICD-10-CM

## 2013-10-18 MED ORDER — FUROSEMIDE 20 MG PO TABS: ORAL_TABLET | ORAL | Status: DC

## 2013-10-25 ENCOUNTER — Other Ambulatory Visit (INDEPENDENT_AMBULATORY_CARE_PROVIDER_SITE_OTHER): Payer: Medicare Other

## 2013-10-25 DIAGNOSIS — Z79899 Other long term (current) drug therapy: Secondary | ICD-10-CM

## 2013-10-25 LAB — BASIC METABOLIC PANEL
BUN: 21 mg/dL (ref 6–23)
CO2: 25 mEq/L (ref 19–32)
Calcium: 9.2 mg/dL (ref 8.4–10.5)
Chloride: 107 mEq/L (ref 96–112)
Creatinine, Ser: 1.5 mg/dL (ref 0.4–1.5)
GFR: 55.24 mL/min — AB (ref >60.00)
Glucose, Bld: 99 mg/dL (ref 70–99)
POTASSIUM: 4 meq/L (ref 3.5–5.1)
Sodium: 139 mEq/L (ref 135–145)

## 2013-11-30 ENCOUNTER — Encounter: Payer: Self-pay | Admitting: Nurse Practitioner

## 2013-11-30 ENCOUNTER — Ambulatory Visit (INDEPENDENT_AMBULATORY_CARE_PROVIDER_SITE_OTHER): Payer: Medicare Other | Admitting: Nurse Practitioner

## 2013-11-30 ENCOUNTER — Telehealth: Payer: Self-pay | Admitting: Cardiology

## 2013-11-30 ENCOUNTER — Emergency Department (HOSPITAL_COMMUNITY)
Admission: EM | Admit: 2013-11-30 | Discharge: 2013-11-30 | Disposition: A | Discharge status: Home / Self Care | Payer: Medicare Other | Attending: Emergency Medicine | Admitting: Emergency Medicine

## 2013-11-30 ENCOUNTER — Emergency Department (HOSPITAL_COMMUNITY): Payer: Medicare Other

## 2013-11-30 ENCOUNTER — Other Ambulatory Visit: Payer: Self-pay | Admitting: Nurse Practitioner

## 2013-11-30 ENCOUNTER — Encounter (HOSPITAL_COMMUNITY): Payer: Self-pay | Admitting: Emergency Medicine

## 2013-11-30 VITALS — BP 112/80 | HR 100 | Resp 30 | Ht 72.0 in | Wt 172.8 lb

## 2013-11-30 DIAGNOSIS — Z8739 Personal history of other diseases of the musculoskeletal system and connective tissue: Secondary | ICD-10-CM | POA: Diagnosis not present

## 2013-11-30 DIAGNOSIS — R609 Edema, unspecified: Secondary | ICD-10-CM | POA: Insufficient documentation

## 2013-11-30 DIAGNOSIS — I420 Dilated cardiomyopathy: Secondary | ICD-10-CM

## 2013-11-30 DIAGNOSIS — R0602 Shortness of breath: Secondary | ICD-10-CM | POA: Insufficient documentation

## 2013-11-30 DIAGNOSIS — R51 Headache: Secondary | ICD-10-CM | POA: Insufficient documentation

## 2013-11-30 DIAGNOSIS — I493 Ventricular premature depolarization: Secondary | ICD-10-CM

## 2013-11-30 DIAGNOSIS — Z79899 Other long term (current) drug therapy: Secondary | ICD-10-CM | POA: Diagnosis not present

## 2013-11-30 DIAGNOSIS — I509 Heart failure, unspecified: Secondary | ICD-10-CM | POA: Diagnosis not present

## 2013-11-30 DIAGNOSIS — J0100 Acute maxillary sinusitis, unspecified: Secondary | ICD-10-CM | POA: Insufficient documentation

## 2013-11-30 DIAGNOSIS — I5021 Acute systolic (congestive) heart failure: Secondary | ICD-10-CM

## 2013-11-30 DIAGNOSIS — R0600 Dyspnea, unspecified: Secondary | ICD-10-CM | POA: Diagnosis not present

## 2013-11-30 DIAGNOSIS — I5023 Acute on chronic systolic (congestive) heart failure: Secondary | ICD-10-CM

## 2013-11-30 HISTORY — DX: Heart failure, unspecified: I50.9

## 2013-11-30 LAB — BASIC METABOLIC PANEL
Anion gap: 14 (ref 5–15)
BUN: 21 mg/dL (ref 6–23)
CHLORIDE: 100 meq/L (ref 96–112)
CO2: 25 mEq/L (ref 19–32)
Calcium: 9.5 mg/dL (ref 8.4–10.5)
Creatinine, Ser: 1.41 mg/dL — ABNORMAL HIGH (ref 0.50–1.35)
GFR calc non Af Amer: 41 mL/min — ABNORMAL LOW (ref >90)
GFR, EST AFRICAN AMERICAN: 48 mL/min — AB (ref >90)
Glucose, Bld: 99 mg/dL (ref 70–99)
POTASSIUM: 4.1 meq/L (ref 3.7–5.3)
SODIUM: 139 meq/L (ref 137–147)

## 2013-11-30 LAB — CBC WITH DIFFERENTIAL/PLATELET
Basophils Absolute: 0 10*3/uL (ref 0.0–0.1)
Basophils Relative: 0 % (ref 0–1)
EOS ABS: 0 10*3/uL (ref 0.0–0.7)
Eosinophils Relative: 0 % (ref 0–5)
HCT: 43 % (ref 39.0–52.0)
HEMOGLOBIN: 15.8 g/dL (ref 13.0–17.0)
LYMPHS ABS: 1.7 10*3/uL (ref 0.7–4.0)
LYMPHS PCT: 21 % (ref 12–46)
MCH: 29.7 pg (ref 26.0–34.0)
MCHC: 36.7 g/dL — ABNORMAL HIGH (ref 30.0–36.0)
MCV: 80.8 fL (ref 78.0–100.0)
Monocytes Absolute: 0.6 10*3/uL (ref 0.1–1.0)
Monocytes Relative: 7 % (ref 3–12)
NEUTROS ABS: 5.9 10*3/uL (ref 1.7–7.7)
Neutrophils Relative %: 72 % (ref 43–77)
PLATELETS: 144 10*3/uL — AB (ref 150–400)
RBC: 5.32 MIL/uL (ref 4.22–5.81)
RDW: 14.5 % (ref 11.5–15.5)
WBC: 8.2 10*3/uL (ref 4.0–10.5)

## 2013-11-30 LAB — I-STAT TROPONIN, ED: TROPONIN I, POC: 0.01 ng/mL (ref 0.00–0.08)

## 2013-11-30 LAB — PROTIME-INR
INR: 1.16 (ref 0.00–1.49)
Prothrombin Time: 14.9 seconds (ref 11.6–15.2)

## 2013-11-30 LAB — APTT: APTT: 29 s (ref 24–37)

## 2013-11-30 LAB — PRO B NATRIURETIC PEPTIDE: Pro B Natriuretic peptide (BNP): 16389 pg/mL — ABNORMAL HIGH (ref 0–450)

## 2013-11-30 MED ORDER — FUROSEMIDE 40 MG PO TABS: 40.0000 mg | ORAL_TABLET | Freq: Two times a day (BID) | ORAL | Status: AC

## 2013-11-30 MED ORDER — AMOXICILLIN 500 MG PO CAPS: 500.0000 mg | ORAL_CAPSULE | Freq: Three times a day (TID) | ORAL | Status: DC

## 2013-11-30 NOTE — Telephone Encounter (Signed)
Follow up     Pt is having sob and edema.  Daughter want him seen today

## 2013-11-30 NOTE — H&P (Signed)
Derek Simpson H 11/30/2013  

## 2013-11-30 NOTE — Telephone Encounter (Signed)
Received verbal OK from patient to speak with his daughter Larita Fife. He is having increased sob, le edema, coughing with some wheezing, occasionally expectorating mucus. They think he has gained about 2 labs recently. Also has neck pain and a headache. Appointment with Norma Fredrickson NP today at 11:30 am.

## 2013-11-30 NOTE — Discharge Instructions (Signed)
Sinusitis °Sinusitis is redness, soreness, and inflammation of the paranasal sinuses. Paranasal sinuses are air pockets within the bones of your face (beneath the eyes, the middle of the forehead, or above the eyes). In healthy paranasal sinuses, mucus is able to drain out, and air is able to circulate through them by way of your nose. However, when your paranasal sinuses are inflamed, mucus and air can become trapped. This can allow bacteria and other germs to grow and cause infection. °Sinusitis can develop quickly and last only a short time (acute) or continue over a long period (chronic). Sinusitis that lasts for more than 12 weeks is considered chronic.  °CAUSES  °Causes of sinusitis include: °· Allergies. °· Structural abnormalities, such as displacement of the cartilage that separates your nostrils (deviated septum), which can decrease the air flow through your nose and sinuses and affect sinus drainage. °· Functional abnormalities, such as when the small hairs (cilia) that line your sinuses and help remove mucus do not work properly or are not present. °SIGNS AND SYMPTOMS  °Symptoms of acute and chronic sinusitis are the same. The primary symptoms are pain and pressure around the affected sinuses. Other symptoms include: °· Upper toothache. °· Earache. °· Headache. °· Bad breath. °· Decreased sense of smell and taste. °· A cough, which worsens when you are lying flat. °· Fatigue. °· Fever. °· Thick drainage from your nose, which often is green and may contain pus (purulent). °· Swelling and warmth over the affected sinuses. °DIAGNOSIS  °Your health care provider will perform a physical exam. During the exam, your health care provider may: °· Look in your nose for signs of abnormal growths in your nostrils (nasal polyps). °· Tap over the affected sinus to check for signs of infection. °· View the inside of your sinuses (endoscopy) using an imaging device that has a light attached (endoscope). °If your health  care provider suspects that you have chronic sinusitis, one or more of the following tests may be recommended: °· Allergy tests. °· Nasal culture. A sample of mucus is taken from your nose, sent to a lab, and screened for bacteria. °· Nasal cytology. A sample of mucus is taken from your nose and examined by your health care provider to determine if your sinusitis is related to an allergy. °TREATMENT  °Most cases of acute sinusitis are related to a viral infection and will resolve on their own within 10 days. Sometimes medicines are prescribed to help relieve symptoms (pain medicine, decongestants, nasal steroid sprays, or saline sprays).  °However, for sinusitis related to a bacterial infection, your health care provider will prescribe antibiotic medicines. These are medicines that will help kill the bacteria causing the infection.  °Rarely, sinusitis is caused by a fungal infection. In theses cases, your health care provider will prescribe antifungal medicine. °For some cases of chronic sinusitis, surgery is needed. Generally, these are cases in which sinusitis recurs more than 3 times per year, despite other treatments. °HOME CARE INSTRUCTIONS  °· Drink plenty of water. Water helps thin the mucus so your sinuses can drain more easily. °· Use a humidifier. °· Inhale steam 3 to 4 times a day (for example, sit in the bathroom with the shower running). °· Apply a warm, moist washcloth to your face 3 to 4 times a day, or as directed by your health care provider. °· Use saline nasal sprays to help moisten and clean your sinuses. °· Take medicines only as directed by your health care provider. °·   If you were prescribed either an antibiotic or antifungal medicine, finish it all even if you start to feel better. SEEK IMMEDIATE MEDICAL CARE IF:  You have increasing pain or severe headaches.  You have nausea, vomiting, or drowsiness.  You have swelling around your face.  You have vision problems.  You have a stiff  neck.  You have difficulty breathing. MAKE SURE YOU:   Understand these instructions.  Will watch your condition.  Will get help right away if you are not doing well or get worse. Document Released: 01/27/2005 Document Revised: 06/13/2013 Document Reviewed: 02/11/2011 [facility] Patient Information 2015 [place], [place]. This information is not intended to replace advice given to you by your health care provider. Make sure you discuss any questions you have with your health care provider. Heart Failure Heart failure is a condition in which the heart has trouble pumping blood. This means your heart does not pump blood efficiently for your body to work well. In some cases of heart failure, fluid may back up into your lungs or you may have swelling (edema) in your lower legs. Heart failure is usually a long-term (chronic) condition. It is important for you to take good care of yourself and follow your health care provider's treatment plan. CAUSES  Some health conditions can cause heart failure. Those health conditions include:  High blood pressure (hypertension). Hypertension causes the heart muscle to work harder than normal. When pressure in the blood vessels is high, the heart needs to pump (contract) with more force in order to circulate blood throughout the body. High blood pressure eventually causes the heart to become stiff and weak.  Coronary artery disease (CAD). CAD is the buildup of cholesterol and fat (plaque) in the arteries of the heart. The blockage in the arteries deprives the heart muscle of oxygen and blood. This can cause chest pain and may lead to a heart attack. High blood pressure can also contribute to CAD.  Heart attack (myocardial infarction). A heart attack occurs when one or more arteries in the heart become blocked. The loss of oxygen damages the muscle tissue of the heart. When this happens, part of the heart muscle dies. The injured tissue does not contract as well and  weakens the heart's ability to pump blood.  Abnormal heart valves. When the heart valves do not open and close properly, it can cause heart failure. This makes the heart muscle pump harder to keep the blood flowing.  Heart muscle disease (cardiomyopathy or myocarditis). Heart muscle disease is damage to the heart muscle from a variety of causes. These can include drug or alcohol abuse, infections, or unknown reasons. These can increase the risk of heart failure.  Lung disease. Lung disease makes the heart work harder because the lungs do not work properly. This can cause a strain on the heart, leading it to fail.  Diabetes. Diabetes increases the risk of heart failure. High blood sugar contributes to high fat (lipid) levels in the blood. Diabetes can also cause slow damage to tiny blood vessels that carry important nutrients to the heart muscle. When the heart does not get enough oxygen and food, it can cause the heart to become weak and stiff. This leads to a heart that does not contract efficiently.  Other conditions can contribute to heart failure. These include abnormal heart rhythms, thyroid problems, and low blood counts (anemia). Certain unhealthy behaviors can increase the risk of heart failure, including:  Being overweight.  Smoking or chewing tobacco.  Eating  foods high in fat and cholesterol.  Abusing illicit drugs or alcohol.  Lacking physical activity. SYMPTOMS  Heart failure symptoms may vary and can be hard to detect. Symptoms may include:  Shortness of breath with activity, such as climbing stairs.  Persistent cough.  Swelling of the feet, ankles, legs, or abdomen.  Unexplained weight gain.  Difficulty breathing when lying flat (orthopnea).  Waking from sleep because of the need to sit up and get more air.  Rapid heartbeat.  Fatigue and loss of energy.  Feeling light-headed, dizzy, or close to fainting.  Loss of appetite.  Nausea.  Increased urination  during the night (nocturia). DIAGNOSIS  A diagnosis of heart failure is based on your history, symptoms, physical examination, and diagnostic tests. Diagnostic tests for heart failure may include:  Echocardiography.  Electrocardiography.  Chest X-ray.  Blood tests.  Exercise stress test.  Cardiac angiography.  Radionuclide scans. TREATMENT  Treatment is aimed at managing the symptoms of heart failure. Medicines, behavioral changes, or surgical intervention may be necessary to treat heart failure.  Medicines to help treat heart failure may include:  Angiotensin-converting enzyme (ACE) inhibitors. This type of medicine blocks the effects of a blood protein called angiotensin-converting enzyme. ACE inhibitors relax (dilate) the blood vessels and help lower blood pressure.  Angiotensin receptor blockers (ARBs). This type of medicine blocks the actions of a blood protein called angiotensin. Angiotensin receptor blockers dilate the blood vessels and help lower blood pressure.  Water pills (diuretics). Diuretics cause the kidneys to remove salt and water from the blood. The extra fluid is removed through urination. This loss of extra fluid lowers the volume of blood the heart pumps.  Beta blockers. These prevent the heart from beating too fast and improve heart muscle strength.  Digitalis. This increases the force of the heartbeat.  Healthy behavior changes include:  Obtaining and maintaining a healthy weight.  Stopping smoking or chewing tobacco.  Eating heart-healthy foods.  Limiting or avoiding alcohol.  Stopping illicit drug use.  Physical activity as directed by your health care provider.  Surgical treatment for heart failure may include:  A procedure to open blocked arteries, repair damaged heart valves, or remove damaged heart muscle tissue.  A pacemaker to improve heart muscle function and control certain abnormal heart rhythms.  An internal cardioverter  defibrillator to treat certain serious abnormal heart rhythms.  A left ventricular assist device (LVAD) to assist the pumping ability of the heart. HOME CARE INSTRUCTIONS   Take medicines only as directed by your health care provider. Medicines are important in reducing the workload of your heart, slowing the progression of heart failure, and improving your symptoms.  Do not stop taking your medicine unless directed by your health care provider.  Do not skip any dose of medicine.  Refill your prescriptions before you run out of medicine. Your medicines are needed every day.  Engage in moderate physical activity if directed by your health care provider. Moderate physical activity can benefit some people. The elderly and people with severe heart failure should consult with a health care provider for physical activity recommendations.  Eat heart-healthy foods. Food choices should be free of trans fat and low in saturated fat, cholesterol, and salt (sodium). Healthy choices include fresh or frozen fruits and vegetables, fish, lean meats, legumes, fat-free or low-fat dairy products, and whole grain or high fiber foods. Talk to a dietitian to learn more about heart-healthy foods.  Limit sodium if directed by your health care provider. Sodium  restriction may reduce symptoms of heart failure in some people. Talk to a dietitian to learn more about heart-healthy seasonings.  Use healthy cooking methods. Healthy cooking methods include roasting, grilling, broiling, baking, poaching, steaming, or stir-frying. Talk to a dietitian to learn more about healthy cooking methods.  Limit fluids if directed by your health care provider. Fluid restriction may reduce symptoms of heart failure in some people.  Weigh yourself every day. Daily weights are important in the early recognition of excess fluid. You should weigh yourself every morning after you urinate and before you eat breakfast. Wear the same amount of  clothing each time you weigh yourself. Record your daily weight. Provide your health care provider with your weight record.  Monitor and record your blood pressure if directed by your health care provider.  Check your pulse if directed by your health care provider.  Lose weight if directed by your health care provider. Weight loss may reduce symptoms of heart failure in some people.  Stop smoking or chewing tobacco. Nicotine makes your heart work harder by causing your blood vessels to constrict. Do not use nicotine gum or patches before talking to your health care provider.  Keep all follow-up visits as directed by your health care provider. This is important.  Limit alcohol intake to no more than 1 drink per day for nonpregnant women and 2 drinks per day for men. One drink equals 12 ounces of beer, 5 ounces of wine, or 1 ounces of hard liquor. Drinking more than that is harmful to your heart. Tell your health care provider if you drink alcohol several times a week. Talk with your health care provider about whether alcohol is safe for you. If your heart has already been damaged by alcohol or you have severe heart failure, drinking alcohol should be stopped completely.  Stop illicit drug use.  Stay up-to-date with immunizations. It is especially important to prevent respiratory infections through current pneumococcal and influenza immunizations.  Manage other health conditions such as hypertension, diabetes, thyroid disease, or abnormal heart rhythms as directed by your health care provider.  Learn to manage stress.  Plan rest periods when fatigued.  Learn strategies to manage high temperatures. If the weather is extremely hot:  Avoid vigorous physical activity.  Use air conditioning or fans or seek a cooler location.  Avoid caffeine and alcohol.  Wear loose-fitting, lightweight, and light-colored clothing.  Learn strategies to manage cold temperatures. If the weather is extremely  cold:  Avoid vigorous physical activity.  Layer clothes.  Wear mittens or gloves, a hat, and a scarf when going outside.  Avoid alcohol.  Obtain ongoing education and support as needed.  Participate in or seek rehabilitation as needed to maintain or improve independence and quality of life. SEEK MEDICAL CARE IF:   Your weight increases by 03 lb/1.4 kg in 1 day or 05 lb/2.3 kg in a week.  You have increasing shortness of breath that is unusual for you.  You are unable to participate in your usual physical activities.  You tire easily.  You cough more than normal, especially with physical activity.  You have any or more swelling in areas such as your hands, feet, ankles, or abdomen.  You are unable to sleep because it is hard to breathe.  You feel like your heart is beating fast (palpitations).  You become dizzy or light-headed upon standing up. SEEK IMMEDIATE MEDICAL CARE IF:   You have difficulty breathing.  There is a change in mental  status such as decreased alertness or difficulty with concentration.  You have a pain or discomfort in your chest.  You have an episode of fainting (syncope). MAKE SURE YOU:   Understand these instructions.  Will watch your condition.  Will get help right away if you are not doing well or get worse. Document Released: 01/27/2005 Document Revised: 06/13/2013 Document Reviewed: 02/27/2012 [facility] Patient Information 2015 [place], [place]. This information is not intended to replace advice given to you by your health care provider. Make sure you discuss any questions you have with your health care provider.

## 2013-11-30 NOTE — H&P (Signed)
Derek Simpson  11/30/2013 11:30 AM   Office Visit  MRN:  [id number]   Description: Male DOB: Dec 29, 1918  Provider: Rosalio MacadamiaLori C Shardae Kleinman, NP  Department: [facility] [address] [facility]          Vital Signs Most recent update: 11/30/2013 11:53 AM by Rosalio MacadamiaLori C Neytiri Asche, NP      BP Pulse Resp Ht Wt BMI      112/80 100 30 6' (1.829 m) 172 lb 12.8 oz (78.382 kg) 23.43 kg/m2      Sp02                96% Comment: at rest                  Vitals History Recorded                Progress Notes      Rosalio MacadamiaLori C Markee Remlinger, NP at 11/30/2013 11:26 AM      Status: Signed               Derek Simpson: Dec 29, 1918 Medical Record #[id number]   History of Present Illness: Mr. Derek Simpson is seen back today for a work in visit. Seen for Dr. Mayford Knifeurner. He is a 80 year old male with a known cardiomyopathy - EF of 15 to 25% with diffuse hypokinesis. Other issues include PVCs and OA of the knees for which he used to use NSAIDs.    He was last seen here in August - that was a post hospital visit for progressive DOE - HF exacerbation. On low dose Coreg and Lasix. It has been noted in the record that the family has not wished to pursue any invasive cardiac workup.    Family called in today with dyspnea, cough, rattling and edema - thus added to my schedule for today.    Comes in today. Here with his daughter Derek Simpson. She says he was ok up until yesterday - he says he has not felt well for a few days. Today complained of more swelling, dyspnea and hard to breath. Coughing up what sounds like white foam. No actual fever. Hard to say how much salt he is getting - his other daughter does the cooking - may have had some NSAIDS as well - hard to say. Does not weigh daily. Also the side of his head/neck hurts. Panting when he first arrived. Tells me his legs are swelling more.     Current Outpatient Prescriptions   Medication  Sig  Dispense  Refill   .  carvedilol (COREG) 3.125 MG tablet  Take 3.125 mg by mouth 2  (two) times daily with a meal.         .  furosemide (LASIX) 20 MG tablet  20mg  2 tablets twice daily for 3 days then 2 tablets daily and recheck BMET in 1 week   60 tablet   3       No current facility-administered medications for this visit.        No Known Allergies    Past Medical History   Diagnosis  Date   .  Arthritis     .  PVC (premature ventricular contraction)          History reviewed. No pertinent past surgical history.    History   Smoking status   .  Never Smoker    Smokeless tobacco   .  Never Used         History  Alcohol Use  No        History reviewed. No pertinent family history.   Review of Systems: The review of systems is per the HPI.  All other systems were reviewed and are negative.   Physical Exam: BP 112/80  Pulse 100  Ht 6' (1.829 m)  Wt 172 lb 12.8 oz (78.382 kg)  BMI 23.43 kg/m2  SpO2 96% Patient is pleasant and in no acute distress. Quite hard of hearing. He was panting when he first came in to be weighed - increased respiratory rate noted -  Improved with rest. Skin is warm and dry. Color is normal.  HEENT is unremarkable. Normocephalic/atraumatic. PERRL. Sclera are nonicteric. Neck is supple. No masses. No JVD. Lungs are clear. Cardiac exam shows a regular rate and rhythm but he is tachycardic. ?S3 - heart tones distant. Abdomen is soft. Extremities are with 1+ edema. Gait not tested. No gross neurologic deficits noted.    Wt Readings from Last 3 Encounters:   11/30/13  172 lb 12.8 oz (78.382 kg)   10/06/13  174 lb 12.8 oz (79.289 kg)   07/05/13  135 lb 12.8 oz (61.598 kg)        LABORATORY DATA/PROCEDURES: EKG with sinus - nonspecific ST/T wave changes noted. His heart rate is lower than on initial check.    Lab Results   Component  Value  Date     WBC  11.7*  07/09/2013     HGB  14.4  07/09/2013     HCT  40.0  07/09/2013     PLT  182  07/09/2013     GLUCOSE  99  10/25/2013     ALT  19  06/15/2013     AST  20   06/15/2013     NA  139  10/25/2013     K  4.0  10/25/2013     CL  107  10/25/2013     CREATININE  1.5  10/25/2013     BUN  21  10/25/2013     CO2  25  10/25/2013        BNP (last 3 results)   Recent Labs   07/09/13 0915  10/06/13 0853  10/14/13 1109   PROBNP  10159.0*  1239.0*  1385.0*      Echo Study Conclusions from May 2015 - Left ventricle: The cavity size was mildly dilated. Systolic function was severely reduced. The estimated ejection fraction was in the range of 15% to 20%. Severe diffuse hypokinesis. - Aortic valve: Trivial regurgitation. - Mitral valve: Mild regurgitation. - Left atrium: The atrium was moderately dilated. - Right ventricle: The cavity size was mildly dilated. Wall thickness was normal. - Right atrium: The atrium was mildly to moderately dilated. - Pericardium, extracardiac: A small pericardial effusion was identified circumferential to the heart.       Assessment / Plan: 1. Acute on chronic systolic HF -  Will admit for IV diuresis - have talked with the daughter Derek Counter - it is still their desire to not have invasive procedures performed - may need to consider palliative care and this was mentioned to her. No beds available. Patient transported by EMS. His code status was discussed with his daughter - he desires to be a full code at this time. Have discussed with Dr. Delton See and she is in agreement with this plan.    2. Advanced age   Patient is agreeable to this plan and will call if any problems develop in  the interim.    Rosalio Macadamia, RN, ANP-C [facility] [address] [place], [place]  [id number] [phone]                    Encounter-Level Documents:            Electronic signature on 11/30/2013 11:36 AM            Referring Provider      Laurena Slimmer, MD           Diagnoses      Acute on chronic systolic CHF (congestive heart failure)    -  Primary       ICD-9-CM: 428.23,  428.0 ICD-10-CM: I50.23      DCM (dilated cardiomyopathy)          ICD-9-CM: 425.4 ICD-10-CM: I42.0      PVC (premature ventricular contraction)          ICD-9-CM: 427.69 ICD-10-CM: I49.3             Reason for Visit      Shortness of Breath      Work in visit - seen for Dr. Mayford Knife.                          Level of Service      PR OFFICE OUTPATIENT VISIT 25 MINUTES [99214]           Follow-up and Disposition      Routing History Recorded             All Charges for This Encounter      Code Description Service Date Service Provider Modifiers Qty      93000 PR ELECTROCARDIOGRAM, COMPLETE 11/30/2013 Rosalio Macadamia, NP   1      [phone] PR OFFICE OUTPATIENT VISIT 25 MINUTES 11/30/2013 Rosalio Macadamia, NP   1      [phone] PR CURRENT TOBACCO NON-USER 11/30/2013 Rosalio Macadamia, NP   1             AVS Reports      Date/Time Report Action User      11/30/2013 10:10 AM After Visit Summary Printed Debbe Bales             Patient Instructions      We are going to admit you to the hospital             Chart Reviewed By      Quintella Reichert, MD  on 11/30/2013  1:07 PM             Previous Visit        Provider Department Encounter #      11/30/2013  9:18 AM Norma Fredrickson, NP [facility] [id number]

## 2013-11-30 NOTE — ED Notes (Signed)
Returned from CT.

## 2013-11-30 NOTE — ED Notes (Signed)
MD at bedside. 

## 2013-11-30 NOTE — Progress Notes (Signed)
Derek Simpson Date of Birth: 1918/07/15 Medical Record #[id number]  History of Present Illness: Derek Simpson is seen back today for a work in visit. Seen for Dr. Mayford Knifeurner. He is a 80 year old male with a known cardiomyopathy - EF of 15 to 25% with diffuse hypokinesis. Other issues include PVCs and OA of the knees for which he used to use NSAIDs.   He was last seen here in August - that was a post hospital visit for progressive DOE - HF exacerbation. On low dose Coreg and Lasix. It has been noted in the record that the family has not wished to pursue any invasive cardiac workup.   Family called in today with dyspnea, cough, rattling and edema - thus added to my schedule for today.   Comes in today. Here with his daughter Derek Simpson. She says he was ok up until yesterday - he says he has not felt well for a few days. Today complained of more swelling, dyspnea and hard to breath. Coughing up what sounds like white foam. No actual fever. Hard to say how much salt he is getting - his other daughter does the cooking - may have had some NSAIDS as well - hard to say. Does not weigh daily. Also the side of his head/neck hurts. Panting when he first arrived. Tells me his legs are swelling more.   Current Outpatient Prescriptions  Medication Sig Dispense Refill  . carvedilol (COREG) 3.125 MG tablet Take 3.125 mg by mouth 2 (two) times daily with a meal.      . furosemide (LASIX) 20 MG tablet 20mg  2 tablets twice daily for 3 days then 2 tablets daily and recheck BMET in 1 week  60 tablet  3   No current facility-administered medications for this visit.    No Known Allergies  Past Medical History  Diagnosis Date  . Arthritis   . PVC (premature ventricular contraction)     History reviewed. No pertinent past surgical history.  History  Smoking status  . Never Smoker   Smokeless tobacco  . Never Used    History  Alcohol Use No    History reviewed. No pertinent family history.  Review of  Systems: The review of systems is per the HPI.  All other systems were reviewed and are negative.  Physical Exam: BP 112/80  Pulse 100  Ht 6' (1.829 m)  Wt 172 lb 12.8 oz (78.382 kg)  BMI 23.43 kg/m2  SpO2 96% Patient is pleasant and in no acute distress. Quite hard of hearing. He was panting when he first came in to be weighed - increased respiratory rate noted -  Improved with rest. Skin is warm and dry. Color is normal.  HEENT is unremarkable. Normocephalic/atraumatic. PERRL. Sclera are nonicteric. Neck is supple. No masses. No JVD. Lungs are clear. Cardiac exam shows a regular rate and rhythm but he is tachycardic. ?S3 - heart tones distant. Abdomen is soft. Extremities are with 1+ edema. Gait not tested. No gross neurologic deficits noted.  Wt Readings from Last 3 Encounters:  11/30/13 172 lb 12.8 oz (78.382 kg)  10/06/13 174 lb 12.8 oz (79.289 kg)  07/05/13 135 lb 12.8 oz (61.598 kg)    LABORATORY DATA/PROCEDURES: EKG with sinus - nonspecific ST/T wave changes noted. His heart rate is lower than on initial check.  Lab Results  Component Value Date   WBC 11.7* 07/09/2013   HGB 14.4 07/09/2013   HCT 40.0 07/09/2013   PLT 182 07/09/2013  GLUCOSE 99 10/25/2013   ALT 19 06/15/2013   AST 20 06/15/2013   NA 139 10/25/2013   K 4.0 10/25/2013   CL 107 10/25/2013   CREATININE 1.5 10/25/2013   BUN 21 10/25/2013   CO2 25 10/25/2013    BNP (last 3 results)  Recent Labs  07/09/13 0915 10/06/13 0853 10/14/13 1109  PROBNP 10159.0* 1239.0* 1385.0*   Echo Study Conclusions from May 2015 - Left ventricle: The cavity size was mildly dilated. Systolic function was severely reduced. The estimated ejection fraction was in the range of 15% to 20%. Severe diffuse hypokinesis. - Aortic valve: Trivial regurgitation. - Mitral valve: Mild regurgitation. - Left atrium: The atrium was moderately dilated. - Right ventricle: The cavity size was mildly dilated. Wall thickness was normal. - Right  atrium: The atrium was mildly to moderately dilated. - Pericardium, extracardiac: A small pericardial effusion was identified circumferential to the heart.    Assessment / Plan: 1. Acute on chronic systolic HF -  Will admit for IV diuresis - have talked with the daughter Derek Counter - it is still their desire to not have invasive procedures performed - may need to consider palliative care and this was mentioned to her. No beds available. Patient transported by EMS. His code status was discussed with his daughter - he desires to be a full code at this time. Have discussed with Dr. Delton See and she is in agreement with this plan.   2. Advanced age  Patient is agreeable to this plan and will call if any problems develop in the interim.   Rosalio Macadamia, RN, ANP-C [facility] [address] [place], [place]  [address] [phone]

## 2013-11-30 NOTE — ED Notes (Signed)
Patient transported to CT 

## 2013-11-30 NOTE — Patient Instructions (Signed)
We are going to admit you to the hospital.  

## 2013-11-30 NOTE — ED Provider Notes (Signed)
CSN: [id number]     Arrival date & time 11/30/13  1305 History   First MD Initiated Contact with Patient 11/30/13 1337     Chief Complaint  Patient presents with  . Shortness of Breath  . Congestive Heart Failure     (Consider location/radiation/quality/duration/timing/severity/associated sxs/prior Treatment) HPI Patient presents from his primary care physician's office today. His daughter reports the pain symptoms have been a left sided headache and facial pain. This seemed to start if a couple of days ago. It is of gradual onset. At this point time the patient has no associated headache but has still been endorsing some left-sided facial pain and discomfort. He has had no symptoms the mental status change. There is no associated fever. No neck stiffness. The other complaint is for some degree of shortness of breath. His daughter reports that he has had some increased swelling in his feet and ankles. It is not as severe as it has been in the past but they are noticing some increased. The patient has limited activities do to weakness in his legs that is chronic. They have been noticing a slight increase in shortness of breath. But no chest pain. When the patient elevate his legs the swelling does seem to improve. He does note right now is her during the exam that his feet and legs appear less swollen than they did earlier today.  Past Medical History  Diagnosis Date  . Arthritis   . PVC (premature ventricular contraction)   . CHF (congestive heart failure)    History reviewed. No pertinent past surgical history. No family history on file. History  Substance Use Topics  . Smoking status: Never Smoker   . Smokeless tobacco: Never Used  . Alcohol Use: No    Review of Systems  10 Systems reviewed and are negative for acute change except as noted in the HPI.   Allergies  Review of patient's allergies indicates no known allergies.  Home Medications   Prior to Admission medications    Medication Sig Start Date End Date Taking? Authorizing Provider  furosemide (LASIX) 20 MG tablet Take 20 mg by mouth 2 (two) times daily.   Yes Historical Provider, MD  amoxicillin (AMOXIL) 500 MG capsule Take 1 capsule (500 mg total) by mouth 3 (three) times daily. 11/30/13   Arby Barrette, MD  furosemide (LASIX) 40 MG tablet Take 1 tablet (40 mg total) by mouth 2 (two) times daily. 11/30/13 12/02/13  Arby Barrette, MD   BP 116/85  Pulse 91  Temp(Src) 97.8 F (36.6 C) (Oral)  Resp 16  SpO2 97% Physical Exam  Constitutional: He is oriented to person, place, and time. He appears well-developed and well-nourished.  Patient is a alert and interactive gentleman he has no respiratory distress and is nontoxic in appearance.  HENT:  Head: Normocephalic and atraumatic.  Right Ear: External ear normal.  Left Ear: External ear normal.  Nose: Nose normal.  Mouth/Throat: No oropharyngeal exudate.  Left TM is normal in appearance. His neck is supple. There are no lesions or swelling is along the face or scalp.  Eyes: EOM are normal. Pupils are equal, round, and reactive to light. Right eye exhibits no discharge. Left eye exhibits no discharge. No scleral icterus.  Neck: Neck supple.  Cardiovascular: Normal rate, regular rhythm, normal heart sounds and intact distal pulses.   Pulmonary/Chest: Effort normal and breath sounds normal.  Breath sounds are diminished at the bases however there are no gross rails present. The patient does  not exhibit any respiratory distress at rest. His speech is clear and nonlabored  Abdominal: Soft. Bowel sounds are normal. He exhibits no distension. There is no tenderness.  Musculoskeletal: Normal range of motion. He exhibits edema.  Patient has 1+ to 2+ pitting edema of the feet and the lower portions of the lower legs. This does appear to be less than previous as he has characteristic crenelation of the skin.  Neurological: He is alert and oriented to person,  place, and time. He has normal strength. No cranial nerve deficit. Coordination normal. GCS eye subscore is 4. GCS verbal subscore is 5. GCS motor subscore is 6.  Skin: Skin is warm, dry and intact.  Psychiatric: He has a normal mood and affect.    ED Course  Procedures (including critical care time) Labs Review Labs Reviewed  BASIC METABOLIC PANEL - Abnormal; Notable for the following:    Creatinine, Ser 1.41 (*)    GFR calc non Af Amer 41 (*)    GFR calc Af Amer 48 (*)    All other components within normal limits  CBC WITH DIFFERENTIAL - Abnormal; Notable for the following:    MCHC 36.7 (*)    Platelets 144 (*)    All other components within normal limits  PRO B NATRIURETIC PEPTIDE - Abnormal; Notable for the following:    Pro B Natriuretic peptide (BNP) 16389.0 (*)    All other components within normal limits  APTT  PROTIME-INR  I-STAT TROPOININ, ED    Imaging Review Ct Head Wo Contrast  11/30/2013   CLINICAL DATA:  Left-sided headaches  EXAM: CT HEAD WITHOUT CONTRAST  TECHNIQUE: Contiguous axial images were obtained from the base of the skull through the vertex without intravenous contrast.  COMPARISON:  None.  FINDINGS: The bony calvarium is intact. There is near total opacification of the left maxillary antrum. This is of uncertain chronicity and may contribute to the patient's underlying clinical abnormality. Mild atrophic changes and chronic white matter ischemic change is noted. Areas of prior infarct are seen in the left frontal and left posterior parietal-occipital region. Diffuse basal ganglia calcifications are noted. No findings to suggest acute hemorrhage or acute infarct are noted.  IMPRESSION: Chronic changes without acute abnormality.  Opacification of the left maxillary antrum of uncertain chronicity. This may contribute to the patient's underlying left-sided headaches.   Electronically Signed   By: Alcide CleverMark  Lukens M.D.   On: 11/30/2013 14:28   Dg Chest Port 1  View  11/30/2013   CLINICAL DATA:  Short of breath  EXAM: PORTABLE CHEST - 1 VIEW  COMPARISON:  07/09/2013  FINDINGS: Bibasilar airspace disease has progressed since the prior study and may represent pneumonia or edema. There is a left pleural effusion.  Cardiac enlargement with mild vascular congestion.  IMPRESSION: Bibasilar airspace disease left greater than right with left effusion. Possible pneumonia versus heart failure.   Electronically Signed   By: Marlan Palauharles  Clark M.D.   On: 11/30/2013 14:48     EKG Interpretation None      MDM   Final diagnoses:  Dyspnea  Acute maxillary sinusitis, recurrence not specified  CHF exacerbation   Clinically the patient has a good appearance. His mental status is very clear he is nontoxic. CT does show a maxillary opacification consistent with the patient's pain pattern. He will be treated with amoxicillin for sinusitis. Patient has chronic CHF. I have looked at the comparison of the chest x-rays and don't know there is significant interval change  from old x-rays. Clinically speaking the patient does not appear to be decompensated anyway. At this time I do feel he can increase his Lasix dose at home and have close followup with his family physician.    Arby Barrette, MD 11/30/13 [phone]

## 2013-11-30 NOTE — ED Notes (Addendum)
Per [facility]- Per Pt seen at PCP this am for SOB without CP. Pt had been c/o of SOB since this am. Brought to PCP by his daughter. Pt presents with BIL ankle edema x 2 days. Good CMS. Upon arrival to PCP pt presented with SOB with exertion. Upon EMS arrival pt did not have this presentation nor at present. Pt continues to deny CP. Pt denies CP/ fever N/V any recent illness. PCP sent pt here for further evaluation

## 2013-11-30 NOTE — Telephone Encounter (Signed)
New message   Daughter calling   Patient C/O sob, feet swollen , coughing - rattling,   Would like for him to be seen today.

## 2013-11-30 NOTE — ED Notes (Signed)
Bed: IR51 Expected date:  Expected time:  Means of arrival:  Comments: SOB

## 2013-12-21 ENCOUNTER — Other Ambulatory Visit: Payer: Self-pay

## 2013-12-21 ENCOUNTER — Telehealth: Payer: Self-pay | Admitting: Cardiology

## 2013-12-21 MED ORDER — FUROSEMIDE 20 MG PO TABS: 20.0000 mg | ORAL_TABLET | Freq: Two times a day (BID) | ORAL | Status: DC

## 2013-12-21 NOTE — Telephone Encounter (Signed)
Pt's daughter is aware that Dr Mayford Knife recommends for pt to see his PCP. Daughter verbalized understanding.

## 2013-12-21 NOTE — Telephone Encounter (Signed)
Received call from operator and spoke with pt's daughter.  She reports pt has been OK until last night when he developed shortness of breath. He was able to sleep last night. Today he is complaining of shortness of breath with activity. Uses wheelchair. Shortness of breath improves with rest. Recently treated for sinus infection and has completed course of antibiotics. Does not weigh daily. Slight swelling in feet and ankles but daughter reports swelling looks better than usual. Has been taking lasix as prescribed but has not taken today's AM dose.  I instructed daughter to have pt take Lasix at this time.  Will review with Dr. Mayford Knife for recommendations.

## 2013-12-21 NOTE — Telephone Encounter (Signed)
Please have him see his PCP today 

## 2013-12-21 NOTE — Telephone Encounter (Signed)
Last dose was lasix was increased to 40mg  twice a day.. for  A few days.Marland Kitchen then pt was to return to original  Dose of lasix 20mg   Twice a day . Pt has up coming appt 11/24/015 with our office

## 2013-12-21 NOTE — Telephone Encounter (Signed)
New problem ° ° ° °Pt having SOB.. °

## 2014-01-01 ENCOUNTER — Emergency Department (HOSPITAL_COMMUNITY): Payer: Medicare Other

## 2014-01-01 ENCOUNTER — Inpatient Hospital Stay (HOSPITAL_COMMUNITY)
Admission: EM | Admit: 2014-01-01 | Discharge: 2014-01-04 | DRG: 291 | Disposition: A | Discharge status: Hospice | Payer: Medicare Other | Attending: Internal Medicine | Admitting: Internal Medicine

## 2014-01-01 ENCOUNTER — Encounter (HOSPITAL_COMMUNITY): Payer: Self-pay | Admitting: Emergency Medicine

## 2014-01-01 DIAGNOSIS — N189 Chronic kidney disease, unspecified: Secondary | ICD-10-CM | POA: Diagnosis present

## 2014-01-01 DIAGNOSIS — Z66 Do not resuscitate: Secondary | ICD-10-CM | POA: Diagnosis present

## 2014-01-01 DIAGNOSIS — I420 Dilated cardiomyopathy: Secondary | ICD-10-CM | POA: Diagnosis present

## 2014-01-01 DIAGNOSIS — M1990 Unspecified osteoarthritis, unspecified site: Secondary | ICD-10-CM | POA: Diagnosis present

## 2014-01-01 DIAGNOSIS — Z515 Encounter for palliative care: Secondary | ICD-10-CM

## 2014-01-01 DIAGNOSIS — Z9981 Dependence on supplemental oxygen: Secondary | ICD-10-CM | POA: Diagnosis not present

## 2014-01-01 DIAGNOSIS — I5023 Acute on chronic systolic (congestive) heart failure: Secondary | ICD-10-CM | POA: Diagnosis present

## 2014-01-01 DIAGNOSIS — J069 Acute upper respiratory infection, unspecified: Secondary | ICD-10-CM | POA: Diagnosis present

## 2014-01-01 DIAGNOSIS — R0602 Shortness of breath: Secondary | ICD-10-CM

## 2014-01-01 DIAGNOSIS — Z7189 Other specified counseling: Secondary | ICD-10-CM

## 2014-01-01 DIAGNOSIS — Z9119 Patient's noncompliance with other medical treatment and regimen: Secondary | ICD-10-CM | POA: Diagnosis present

## 2014-01-01 DIAGNOSIS — N179 Acute kidney failure, unspecified: Secondary | ICD-10-CM | POA: Diagnosis present

## 2014-01-01 DIAGNOSIS — I472 Ventricular tachycardia: Secondary | ICD-10-CM | POA: Diagnosis present

## 2014-01-01 DIAGNOSIS — I130 Hypertensive heart and chronic kidney disease with heart failure and stage 1 through stage 4 chronic kidney disease, or unspecified chronic kidney disease: Secondary | ICD-10-CM | POA: Diagnosis not present

## 2014-01-01 DIAGNOSIS — R0600 Dyspnea, unspecified: Secondary | ICD-10-CM

## 2014-01-01 DIAGNOSIS — I509 Heart failure, unspecified: Secondary | ICD-10-CM

## 2014-01-01 DIAGNOSIS — I5022 Chronic systolic (congestive) heart failure: Secondary | ICD-10-CM | POA: Diagnosis present

## 2014-01-01 DIAGNOSIS — I493 Ventricular premature depolarization: Secondary | ICD-10-CM | POA: Diagnosis present

## 2014-01-01 LAB — COMPREHENSIVE METABOLIC PANEL
ALT: 41 U/L (ref 0–53)
ANION GAP: 20 — AB (ref 5–15)
AST: 44 U/L — ABNORMAL HIGH (ref 0–37)
Albumin: 3.7 g/dL (ref 3.5–5.2)
Alkaline Phosphatase: 58 U/L (ref 39–117)
BUN: 27 mg/dL — ABNORMAL HIGH (ref 6–23)
CALCIUM: 10 mg/dL (ref 8.4–10.5)
CO2: 20 mEq/L (ref 19–32)
Chloride: 97 mEq/L (ref 96–112)
Creatinine, Ser: 1.31 mg/dL (ref 0.50–1.35)
GFR calc Af Amer: 52 mL/min — ABNORMAL LOW (ref >90)
GFR calc non Af Amer: 45 mL/min — ABNORMAL LOW (ref >90)
Glucose, Bld: 100 mg/dL — ABNORMAL HIGH (ref 70–99)
Potassium: 4.3 mEq/L (ref 3.7–5.3)
SODIUM: 137 meq/L (ref 137–147)
TOTAL PROTEIN: 7.3 g/dL (ref 6.0–8.3)
Total Bilirubin: 1.6 mg/dL — ABNORMAL HIGH (ref 0.3–1.2)

## 2014-01-01 LAB — CBC
HCT: 41.2 % (ref 39.0–52.0)
HEMOGLOBIN: 15.1 g/dL (ref 13.0–17.0)
MCH: 29.6 pg (ref 26.0–34.0)
MCHC: 36.7 g/dL — AB (ref 30.0–36.0)
MCV: 80.8 fL (ref 78.0–100.0)
Platelets: 156 10*3/uL (ref 150–400)
RBC: 5.1 MIL/uL (ref 4.22–5.81)
RDW: 15 % (ref 11.5–15.5)
WBC: 9.4 10*3/uL (ref 4.0–10.5)

## 2014-01-01 LAB — TROPONIN I

## 2014-01-01 LAB — PRO B NATRIURETIC PEPTIDE: Pro B Natriuretic peptide (BNP): 21597 pg/mL — ABNORMAL HIGH (ref 0–450)

## 2014-01-01 MED ORDER — SODIUM CHLORIDE 0.9 % IJ SOLN: 3.0000 mL | INTRAMUSCULAR | Status: DC | PRN

## 2014-01-01 MED ORDER — CARVEDILOL 3.125 MG PO TABS
3.1250 mg | ORAL_TABLET | Freq: Two times a day (BID) | ORAL | Status: DC
  Administered 2014-01-01 – 2014-01-02 (×3): 3.125 mg via ORAL
  Filled 2014-01-01 (×4): qty 1

## 2014-01-01 MED ORDER — SODIUM CHLORIDE 0.9 % IV SOLN: 250.0000 mL | INTRAVENOUS | Status: DC | PRN

## 2014-01-01 MED ORDER — ONDANSETRON HCL 4 MG/2ML IJ SOLN: 4.0000 mg | Freq: Four times a day (QID) | INTRAMUSCULAR | Status: DC | PRN

## 2014-01-01 MED ORDER — ACETAMINOPHEN 325 MG PO TABS
650.0000 mg | ORAL_TABLET | ORAL | Status: DC | PRN
Start: 2014-01-01 — End: 2014-01-04

## 2014-01-01 MED ORDER — SODIUM CHLORIDE 0.9 % IJ SOLN
3.0000 mL | Freq: Two times a day (BID) | INTRAMUSCULAR | Status: DC
  Administered 2014-01-01 – 2014-01-04 (×5): 3 mL via INTRAVENOUS

## 2014-01-01 MED ORDER — ENOXAPARIN SODIUM 40 MG/0.4ML ~~LOC~~ SOLN
40.0000 mg | SUBCUTANEOUS | Status: DC
  Administered 2014-01-01 – 2014-01-02 (×2): 40 mg via SUBCUTANEOUS
  Filled 2014-01-01 (×3): qty 0.4

## 2014-01-01 MED ORDER — FUROSEMIDE 10 MG/ML IJ SOLN
80.0000 mg | Freq: Once | INTRAMUSCULAR | Status: AC
  Administered 2014-01-01: 80 mg via INTRAVENOUS
  Filled 2014-01-01: qty 8

## 2014-01-01 MED ORDER — FUROSEMIDE 10 MG/ML IJ SOLN
40.0000 mg | Freq: Two times a day (BID) | INTRAMUSCULAR | Status: DC
  Administered 2014-01-02 – 2014-01-03 (×3): 40 mg via INTRAVENOUS
  Filled 2014-01-01 (×5): qty 4

## 2014-01-01 NOTE — ED Notes (Signed)
Pt has CHF, pt c/o weakness, SOB and fluid build up in feet. Pt states this has been going on for 1 week. Pt has not taken lasix in 2 days. Denies pain

## 2014-01-01 NOTE — ED Provider Notes (Signed)
CSN: [id number]     Arrival date & time 01/01/14  1442 History   First MD Initiated Contact with Patient 01/01/14 1510     Chief Complaint  Patient presents with  . Shortness of Breath  . Congestive Heart Failure      HPI  Patient presents with family for evaluation for difficulty breathing, weakness and fatigue, and leg swelling. Patient has a history of congestive heart failure. Is frequently noncompliant with medications because of the frequency of urination when he takes his Lasix. Family states he has not taken Lasix for at least several days.  Patient was seen by Primary care physician on Thursday and placed on amoxicillin for "upper respiratory infection".  [facility] states he has a new appointment with [facility] on the 30th.  For the last few days he's been inactive. Not up and around at home. Short breath and weak. Reports marked increase in swelling in his legs. No fever. Occasional cough dry and nonproductive. No pain. Particular no chest pain.   Past Medical History  Diagnosis Date  . Arthritis   . PVC (premature ventricular contraction)   . CHF (congestive heart failure)    History reviewed. No pertinent past surgical history. No family history on file. History  Substance Use Topics  . Smoking status: Never Smoker   . Smokeless tobacco: Never Used  . Alcohol Use: No    Review of Systems  Constitutional: Positive for fatigue. Negative for fever, chills, diaphoresis and appetite change.  HENT: Negative for mouth sores, sore throat and trouble swallowing.   Eyes: Negative for visual disturbance.  Respiratory: Positive for shortness of breath. Negative for cough, chest tightness and wheezing.   Cardiovascular: Positive for leg swelling. Negative for chest pain.  Gastrointestinal: Negative for nausea, vomiting, abdominal pain, diarrhea and abdominal distention.  Endocrine: Negative for polydipsia, polyphagia and  polyuria.  Genitourinary: Negative for dysuria, frequency and hematuria.  Musculoskeletal: Negative for gait problem.  Skin: Negative for color change, pallor and rash.  Neurological: Positive for weakness. Negative for dizziness, syncope, light-headedness and headaches.  Hematological: Does not bruise/bleed easily.  Psychiatric/Behavioral: Negative for behavioral problems and confusion.      Allergies  Review of patient's allergies indicates no known allergies.  Home Medications   Prior to Admission medications   Medication Sig Start Date End Date Taking? Authorizing Provider  amoxicillin (AMOXIL) 500 MG capsule Take 1 capsule (500 mg total) by mouth 3 (three) times daily. 11/30/13  Yes Arby Barrette, MD  furosemide (LASIX) 20 MG tablet Take 1 tablet (20 mg total) by mouth 2 (two) times daily. 12/21/13  Yes Quintella Reichert, MD   BP 110/85 mmHg  Pulse 97  Temp(Src) 98 F (36.7 C) (Oral)  Resp 20  SpO2 97% Physical Exam  Constitutional: He is oriented to person, place, and time. He appears well-developed and well-nourished. No distress.  HOH, but awake alert.  HENT:  Head: Normocephalic.  Eyes: Conjunctivae are normal. Pupils are equal, round, and reactive to light. No scleral icterus.  Neck: Normal range of motion. Neck supple. JVD present. No thyromegaly present.  Cardiovascular: An irregular rhythm present. Tachycardia present.  Exam reveals no gallop and no friction rub.   No murmur heard. Sinus rhythm with frequent PACs.  Pulmonary/Chest: Effort normal and breath sounds normal. No respiratory distress. He has no wheezes. He has no rales.  Diminished bIbasilar breath sounds with crackles.  Abdominal: Soft. Bowel sounds are  normal. He exhibits no distension. There is no tenderness. There is no rebound.  Musculoskeletal: Normal range of motion.  Neurological: He is alert and oriented to person, place, and time.  Skin: Skin is warm and dry. No rash noted.  2 plus bilateral  symmetric lower extremity edema. No obvious cellulitis. Nonpainful.  Psychiatric: He has a normal mood and affect. His behavior is normal.    ED Course  Procedures (including critical care time) Labs Review Labs Reviewed  CBC - Abnormal; Notable for the following:    MCHC 36.7 (*)    All other components within normal limits  PRO B NATRIURETIC PEPTIDE - Abnormal; Notable for the following:    Pro B Natriuretic peptide (BNP) 21597.0 (*)    All other components within normal limits  TROPONIN I  COMPREHENSIVE METABOLIC PANEL    Imaging Review Dg Chest 2 View  01/01/2014   CLINICAL DATA:  Shortness of breath, congestive heart failure.  EXAM: CHEST  2 VIEW  COMPARISON:  11/30/2013  FINDINGS: Cardiomegaly. Improving airspace opacity, likely improving edema. Mild residual perihilar and lower lobe opacities/ edema and small effusions. No acute bony abnormality. Degenerative changes in the shoulders and lower thoracic spine.  IMPRESSION: Improving pulmonary edema pattern. Continued mild edema and small bilateral effusions.   Electronically Signed   By: Charlett NoseKevin  Dover M.D.   On: 01/01/2014 15:47     EKG Interpretation   Date/Time:  Sunday January 01 2014 14:59:40 EST Ventricular Rate:  104 PR Interval:  212 QRS Duration: 120 QT Interval:  396 QTC Calculation: 521 R Axis:   -41 Text Interpretation:  Sinus tachycardia Paired ventricular premature  complexes Borderline prolonged PR interval Incomplete left bundle branch  block LVH with secondary repolarization abnormality No acute changes  Confirmed by Rhunette CroftNANAVATI, MD, ANKIT [phone]([id number]) on 01/01/2014 3:40:57 PM       ECHO 06-16-2013:  Study Conclusions  - Left ventricle: The cavity size was mildly dilated. Systolic function was severely reduced. The estimated ejection fraction was in the range of 15% to 20%. Severe diffuse hypokinesis. - Aortic valve: Trivial regurgitation. - Mitral valve: Mild regurgitation. - Left atrium: The  atrium was moderately dilated. - Right ventricle: The cavity size was mildly dilated. Wall thickness was normal. - Right atrium: The atrium was mildly to moderately dilated. - Pericardium, extracardiac: A small pericardial effusion was identified circumferential to the heart. MDM   Final diagnoses:  Congestive heart failure, unspecified congestive heart failure chronicity, unspecified congestive heart failure type    At the bedside he has 83%-84% saturations with good waveform on room air. On 2 L he is 94%. Chest x-ray shows bilateral effusions and CHF. Improved versus his most recent chest x-ray. He has elevated BNP. Hemoglobin 15.1. Renal function normal  . Given 20 mg IV Lasix. Considering his age, resting tachycardia, Exertional symptoms, hypoxemia I will discuss admission with hospitalist.    Rolland PorterMark Nedda Gains, MD 01/01/14 1630

## 2014-01-01 NOTE — ED Notes (Signed)
Called spoke with Derek Simpson pt is to go to 1420.

## 2014-01-01 NOTE — ED Notes (Signed)
Pt urine sample sent to lab

## 2014-01-01 NOTE — H&P (Signed)
History and Physical    Derek Simpson KKO:[id number] DOB: 12/20/1918 DOA: 01/01/2014  Referring physician: Dr. Fayrene Fearing PCP: Laurena Slimmer, MD  Specialists: none   Chief Complaint: shortness of breath   HPI: Derek Simpson is a 80 y.o. male has a past medical history significant for chronic systolic congestive heart failure with an ejection fraction of 15-20%, arthritis, hypertension, presents to the emergency room with the chief complaint of leg swelling and shortness of breath for the past few days. Patient reports compliance with his diuretics, however his family tells me that he has not been taking his Lasix consistently because he doesn't like the fact that he needs to use the bathroom. He downplays this and tells me his last Lasix was yesterday. He reports compliance with daily weights, and denies any weight gain. He also endorses poor appetite for the past couple of days. He was having a cough with clear sputum production, and his primary care doctor put him on amoxicillin a few days ago. Based on previous notes, patient was noted by the cardiology that the family did not wish to pursue any invasive cardiac workup. Patient denies any chest pain, Endorses mild abdominal "fullness", denies nausea. He denies any fever or chills. He denies any diarrhea, endorses constipation. In the emergency room, he was found to be clinically in heart failure, and a chest x-ray showed pulmonary edema and small bilateral pleural effusion as well as cardiomegaly. His BNP was elevated to 21,000.  Review of Systems: As per history of present illness, otherwise negative  Past Medical History  Diagnosis Date  . Arthritis   . PVC (premature ventricular contraction)   . CHF (congestive heart failure)    History reviewed. No pertinent past surgical history. Social History:  reports that he has never smoked. He has never used smokeless tobacco. He reports that he does not drink alcohol or use illicit drugs.  No  Known Allergies  Family history noncontributory  Prior to Admission medications   Medication Sig Start Date End Date Taking? Authorizing Provider  amoxicillin (AMOXIL) 500 MG capsule Take 1 capsule (500 mg total) by mouth 3 (three) times daily. 11/30/13  Yes Arby Barrette, MD  furosemide (LASIX) 20 MG tablet Take 1 tablet (20 mg total) by mouth 2 (two) times daily. 12/21/13  Yes Quintella Reichert, MD   Physical Exam: Filed Vitals:   01/01/14 1453  BP: 110/85  Pulse: 97  Temp: 98 F (36.7 C)  TempSrc: Oral  Resp: 20  SpO2: 97%    General:  Mildly tachypneic  Eyes: no scleral icterus  ENT: moist oropharynx  Neck: + JVD  Cardiovascular: regular rate, good peripheral pulses   Respiratory: Bibasilar crackles   Abdomen: soft, non tender to palpation  Skin: no rashes  Musculoskeletal: 2+ pitting edema   Psychiatric: normal mood and affect  Neurologic: non focal  Labs on Admission:  Basic Metabolic Panel:  Recent Labs Lab 01/01/14 1519  NA 137  K 4.3  CL 97  CO2 20  GLUCOSE 100*  BUN 27*  CREATININE 1.31  CALCIUM 10.0   Liver Function Tests:  Recent Labs Lab 01/01/14 1519  AST 44*  ALT 41  ALKPHOS 58  BILITOT 1.6*  PROT 7.3  ALBUMIN 3.7   CBC:  Recent Labs Lab 01/01/14 1519  WBC 9.4  HGB 15.1  HCT 41.2  MCV 80.8  PLT 156   Cardiac Enzymes:  Recent Labs Lab 01/01/14 1519  TROPONINI <0.30    BNP (  last 3 results)  Recent Labs  10/14/13 1109 11/30/13 1436 01/01/14 1519  PROBNP 1385.0* 16389.0* 21597.0*    Radiological Exams on Admission: Dg Chest 2 View  01/01/2014   CLINICAL DATA:  Shortness of breath, congestive heart failure.  EXAM: CHEST  2 VIEW  COMPARISON:  11/30/2013  FINDINGS: Cardiomegaly. Improving airspace opacity, likely improving edema. Mild residual perihilar and lower lobe opacities/ edema and small effusions. No acute bony abnormality. Degenerative changes in the shoulders and lower thoracic spine.  IMPRESSION:  Improving pulmonary edema pattern. Continued mild edema and small bilateral effusions.   Electronically Signed   By: Charlett NoseKevin  Dover M.D.   On: 01/01/2014 15:47    EKG: Independently reviewed. Sinus tachycardia.  Assessment/Plan Principal Problem:   Acute on chronic systolic heart failure Active Problems:   DCM (dilated cardiomyopathy)   PVC (premature ventricular contraction)   Chronic systolic CHF (congestive heart failure)   SOB (shortness of breath)   CHF (congestive heart failure)   Acute on chronic systolic heart failure - patient with known depressed ejection fraction, noncompliant with his diuretic regimen at home and appears to be in florid heart failure in the emergency room with JVD, crackles on lung exam, and bilateral lower extremity edema. He already received 80 mg IV Lasix in the emergency room, will continue 40 twice a day starting tomorrow morning. Will admit to telemetry, obtain daily weights, obtain strict I's and O's. It was mentioned in the previous notes discussions between cardiology and family about palliative care, I'm not able to get in touch with his POA Doristine CounterBertha but this probably needs to be addressed in the morning.   ? Upper respiratory infection - patient likely with cough due to pulmonary edema, he is afebrile and has no leukocytosis. Will not continue amoxicillin.  Mild elevation of his AST and bilirubin - borderline, he has no symptoms related to this, repeat in the morning  Diet: low sodium Fluids: none DVT Prophylaxis: lovenox   Code Status: Presumed full, I'm not sure about patient's understanding at this point, and I was unable to get in touch with his POA. Per previous discussions with the cardiology team in the chart, last time he was full code but they were considering palliative care. Family Communication: Discussed with the granddaughter the bedside, and attempted to contact POA via phone without success Disposition Plan: Admit to telemetry     Time spent: 70 minutes   Signora Zucco M. Elvera LennoxGherghe, MD [facility] Pager [phone]  If 7PM-7AM, please contact night-coverage [email or web address] Password [email or web address] 01/01/2014, 5:56 PM

## 2014-01-01 NOTE — ED Notes (Signed)
Pt requesting catheter placement due too increased urination

## 2014-01-02 ENCOUNTER — Inpatient Hospital Stay (HOSPITAL_COMMUNITY): Payer: Medicare Other

## 2014-01-02 DIAGNOSIS — Z7189 Other specified counseling: Secondary | ICD-10-CM

## 2014-01-02 DIAGNOSIS — I472 Ventricular tachycardia: Secondary | ICD-10-CM

## 2014-01-02 DIAGNOSIS — I5023 Acute on chronic systolic (congestive) heart failure: Secondary | ICD-10-CM

## 2014-01-02 LAB — COMPREHENSIVE METABOLIC PANEL
ALT: 40 U/L (ref 0–53)
ANION GAP: 14 (ref 5–15)
AST: 43 U/L — AB (ref 0–37)
Albumin: 3.2 g/dL — ABNORMAL LOW (ref 3.5–5.2)
Alkaline Phosphatase: 51 U/L (ref 39–117)
BUN: 29 mg/dL — AB (ref 6–23)
CALCIUM: 9.2 mg/dL (ref 8.4–10.5)
CO2: 22 meq/L (ref 19–32)
Chloride: 96 mEq/L (ref 96–112)
Creatinine, Ser: 1.48 mg/dL — ABNORMAL HIGH (ref 0.50–1.35)
GFR, EST AFRICAN AMERICAN: 45 mL/min — AB (ref >90)
GFR, EST NON AFRICAN AMERICAN: 39 mL/min — AB (ref >90)
Glucose, Bld: 96 mg/dL (ref 70–99)
Potassium: 4 mEq/L (ref 3.7–5.3)
Sodium: 132 mEq/L — ABNORMAL LOW (ref 137–147)
Total Bilirubin: 1.4 mg/dL — ABNORMAL HIGH (ref 0.3–1.2)
Total Protein: 6.3 g/dL (ref 6.0–8.3)

## 2014-01-02 LAB — CBC
HCT: 37.2 % — ABNORMAL LOW (ref 39.0–52.0)
Hemoglobin: 13.6 g/dL (ref 13.0–17.0)
MCH: 29 pg (ref 26.0–34.0)
MCHC: 36.6 g/dL — ABNORMAL HIGH (ref 30.0–36.0)
MCV: 79.3 fL (ref 78.0–100.0)
PLATELETS: 137 10*3/uL — AB (ref 150–400)
RBC: 4.69 MIL/uL (ref 4.22–5.81)
RDW: 15 % (ref 11.5–15.5)
WBC: 8 10*3/uL (ref 4.0–10.5)

## 2014-01-02 LAB — PHOSPHORUS: PHOSPHORUS: 3.4 mg/dL (ref 2.3–4.6)

## 2014-01-02 LAB — MAGNESIUM: Magnesium: 2.3 mg/dL (ref 1.5–2.5)

## 2014-01-02 MED ORDER — ALBUTEROL SULFATE (2.5 MG/3ML) 0.083% IN NEBU
2.5000 mg | INHALATION_SOLUTION | Freq: Four times a day (QID) | RESPIRATORY_TRACT | Status: DC | PRN
  Administered 2014-01-02 – 2014-01-03 (×2): 2.5 mg via RESPIRATORY_TRACT
  Filled 2014-01-02 (×2): qty 3

## 2014-01-02 MED ORDER — FUROSEMIDE 10 MG/ML IJ SOLN: 40.0000 mg | Freq: Once | INTRAMUSCULAR | Status: DC

## 2014-01-02 NOTE — Consult Note (Signed)
Reason for Consult: Acute exacerbation of CHF  Requesting Physician: Elvera LennoxGherghe  Cardiologist: Mayford Knifeurner  HPI: This is a 80 y.o. male with a past medical history significant for severe cardiomyopathy of uncertain etiology presents with gradually worsening dyspnea (now with frank orthopnea), edema and cough, abdominal fullness and anorexia associated with uncertain amount of weight gain. He is generally compliant with sodium restriction, but not with daily weight monitoring. According to his family he will skip diuretic doses. Clinical, biochemical and x ray findings are all consistent with biventricular failure. Known LVEF<20%.  Last office weight 172 lb (but he was dyspneic then), now 177 lb. Baseline proBNP at most 1239, today 21597.  Good diuresis with small symptomatic improvement. Creatinine is fairly stable.  PMHx:  Past Medical History  Diagnosis Date  . Arthritis   . PVC (premature ventricular contraction)   . CHF (congestive heart failure)    History reviewed. No pertinent past surgical history.  FAMHx: History reviewed. No pertinent family history.  SOCHx:  reports that he has never smoked. He has never used smokeless tobacco. He reports that he does not drink alcohol or use illicit drugs.  ALLERGIES: No Known Allergies  ROS: Very hard of hearing The patient specifically denies any chest pain at rest or with exertion,syncope, palpitations, focal neurological deficits, intermittent claudication, hemoptysis or wheezing.  The patient also denies nausea, vomiting, dysphagia, diarrhea, constipation, polyuria, polydipsia, dysuria, hematuria, frequency, urgency, abnormal bleeding or bruising, fever, chills,  mood swings, change in skin or hair texture, change in voice quality, auditory or visual problems, allergic reactions or rashes, new musculoskeletal complaints other than usual "aches and pains".   HOME MEDICATIONS: Prescriptions prior to admission  Medication  Sig Dispense Refill Last Dose  . amoxicillin (AMOXIL) 500 MG capsule Take 1 capsule (500 mg total) by mouth 3 (three) times daily. 21 capsule 0 Past Week at Unknown time  . furosemide (LASIX) 20 MG tablet Take 1 tablet (20 mg total) by mouth 2 (two) times daily. 60 tablet 3 Past Week at Unknown time    HOSPITAL MEDICATIONS: I have reviewed the patient's current medications. Scheduled: . carvedilol  3.125 mg Oral BID WC  . enoxaparin (LOVENOX) injection  40 mg Subcutaneous Q24H  . furosemide  40 mg Intravenous Q12H  . sodium chloride  3 mL Intravenous Q12H   Continuous:   VITALS: Blood pressure 106/86, pulse 90, temperature 97.4 F (36.3 C), temperature source Oral, resp. rate 20, height 6' (1.829 m), weight 177 lb 4 oz (80.4 kg), SpO2 98 %.  PHYSICAL EXAM:  General: Alert, oriented x3, mild distress. Cheyne-Stokes breathing. Head: no evidence of trauma, PERRL, EOMI, no exophtalmos or lid lag, no myxedema, no xanthelasma; normal ears, nose and oropharynx Neck: 8 cm elevation in jugular venous pulsations and prompthepatojugular reflux; brisk carotid pulses without delay and no carotid bruits Chest: a few rales and reduced breath sounds bibasilar Cardiovascular: laterally displaced apical impulse, regular rhythm with frequent ectopy, normal first heart sound and loud second heart sound, no rubs or gallops, 3/6 holosystolic murmur at LLSB (probably TR) Abdomen: no tenderness or distention, no masses by palpation, no abnormal pulsatility or arterial bruits, normal bowel sounds, no hepatosplenomegaly Extremities: no clubbing, cyanosis;  2+ pitting pretibial symmetrical edema; 2+ radial, ulnar and brachial pulses bilaterally; 2+ right femoral, posterior tibial and dorsalis pedis pulses; 2+ left femoral, posterior tibial and dorsalis pedis pulses; no subclavian or femoral bruits Neurological: grossly nonfocal   LABS  CBC  Recent  Labs  01/01/14 1519 01/02/14 0354  WBC 9.4 8.0  HGB 15.1  13.6  HCT 41.2 37.2*  MCV 80.8 79.3  PLT 156 137*   Basic Metabolic Panel  Recent Labs  01/01/14 1519 01/02/14 0354  NA 137 132*  K 4.3 4.0  CL 97 96  CO2 20 22  GLUCOSE 100* 96  BUN 27* 29*  CREATININE 1.31 1.48*  CALCIUM 10.0 9.2  MG  --  2.3  PHOS  --  3.4   Liver Function Tests  Recent Labs  01/01/14 1519 01/02/14 0354  AST 44* 43*  ALT 41 40  ALKPHOS 58 51  BILITOT 1.6* 1.4*  PROT 7.3 6.3  ALBUMIN 3.7 3.2*   No results for input(s): LIPASE, AMYLASE in the last 72 hours. Cardiac Enzymes  Recent Labs  01/01/14 1519  TROPONINI <0.30    IMAGING: Dg Chest 2 View  01/01/2014   CLINICAL DATA:  Shortness of breath, congestive heart failure.  EXAM: CHEST  2 VIEW  COMPARISON:  11/30/2013  FINDINGS: Cardiomegaly. Improving airspace opacity, likely improving edema. Mild residual perihilar and lower lobe opacities/ edema and small effusions. No acute bony abnormality. Degenerative changes in the shoulders and lower thoracic spine.  IMPRESSION: Improving pulmonary edema pattern. Continued mild edema and small bilateral effusions.   Electronically Signed   By: Charlett Nose M.D.   On: 01/01/2014 15:47    ECG: ST, PVCs, LBBB  TELEMETRY: Frequent PVCs couplets and brief runs NSVT  IMPRESSION: 1. Advanced (stage D) chronic biventricular heart failure with acute exacerbation/pulmonary edema 2. Signs of poor cardiac output, including Cheyne-Stokes breathing  RECOMMENDATION: 1. Discussed code status. He has terminal CHF and is not a candidate for advanced/surgical therapies due to his age and functional status. Family ([place] and [place]) and patient agree to DNR status. 2. Diuretics and focus on palliation of breathlessness. 3. Consider palliative care 4. Caution with beta blockers as he seems to have poor "forward function" and is decompensated. May help with reduction in PVC burden and this may benefit him, but would not uptitrate any further for 2-3  weeks. 5. Reemphasized role of daily weight monitoring and signs/symptoms of exacerbation to allow early intervention for symptom relief and reduce ED/hospital visits. 6. Consider palliative care consult  Time Spent Directly with Patient: 60 minutes  Thurmon Fair, MD, [facility] [phone] office [phone] pager   01/02/2014, 12:47 PM

## 2014-01-02 NOTE — Clinical Documentation Improvement (Signed)
  Mr. Derek Simpson was admitted for Acute on Chronic CHF on 01/01/14. Mr. Derek Simpson has a history of Hypertension and CKD. "LVH" was noted in the EKG on 01/01/14. Please help by documenting any cause and effect relationship between the patient's hypertension and CHF (and CKD).   Possible Conditions: - Hypertensive Kidney Disease - Hypertensive Heart Disease - Hypertensive Heart and Kidney Disease - No known relationship exists  Thank you for your time with this!   Servando Snare, RN Clinical Documentation Improvement Specialist ([phone] / [phone]

## 2014-01-02 NOTE — Evaluation (Signed)
Physical Therapy Evaluation Patient Details Name: Derek Simpson MRN: [id number] DOB: 1919-01-03 Today's Date: 01/02/2014   History of Present Illness  80 yo male admitted with CHF. Hx of bilateral knee severe arthritis, L worse than R, per pt. Pt is from home-lives with daughter.  Clinical Impression  On eval, pt required Mod assist for bed mobility and Min assist for standing/stand pivot transfers with RW. Dyspnea 3/4 with all activity. Remained on [place] O2 during session. Pt is non ambulatory at baseline-primarily transfers only with use of wheelchair to mobilize throughout home. Discussed d/c plan-plan is for pt to return home with daughter providing assistance as needed. Recommend HHPT, 24 hour supervision/assist as long as daughter can continue to provide current level of care.     Follow Up Recommendations Home health PT;Supervision/Assistance - 24 hour    Equipment Recommendations  None recommended by PT    Recommendations for Other Services       Precautions / Restrictions Precautions Precautions: Fall Precaution Comments: monitior vitals Restrictions Weight Bearing Restrictions: No      Mobility  Bed Mobility Overal bed mobility: Needs Assistance Bed Mobility: Supine to Sit;Sit to Supine     Supine to sit: Mod assist;HOB elevated Sit to supine: Mod assist;HOB elevated   General bed mobility comments: Assist for trunk and bil Les. Increased time.   Transfers Overall transfer level: Needs assistance Equipment used: Rolling walker (2 wheeled) Transfers: Sit to/from UGI CorporationStand;Stand Pivot Transfers Sit to Stand: Min assist;From elevated surface Stand pivot transfers: Min assist;From elevated surface       General transfer comment: Assist to rise, stabilize, control descent. VCs safety, technique, hand placement  Ambulation/Gait             General Gait Details: NT  Stairs            Wheelchair Mobility    Modified Rankin (Stroke Patients Only)        Balance Overall balance assessment: Needs assistance         Standing balance support: Bilateral upper extremity supported;During functional activity Standing balance-Leahy Scale: Poor                               Pertinent Vitals/Pain Pain Assessment: No/denies pain    Home Living Family/patient expects to be discharged to:: Private residence Living Arrangements: Children Available Help at Discharge: Family Type of Home: House Home Access: Stairs to enter Entrance Stairs-Rails: Right Entrance Stairs-Number of Steps: 4 steps in front/ there is a ramp in the back.  Pt uses front entrance and back when he goes out to feed chickens Home Layout: One level Home Equipment: Emergency planning/management officerhower seat;Walker - 2 wheels;Cane - single point;Bedside commode;Wheelchair - IT trainermanual;Electric scooter      Prior Function           Comments: mobilizing at wheelchair level. uses device to pivot to and from chair     Hand Dominance        Extremity/Trunk Assessment   Upper Extremity Assessment: Generalized weakness           Lower Extremity Assessment: Generalized weakness      Cervical / Trunk Assessment: Kyphotic  Communication   Communication: HOH  Cognition Arousal/Alertness: Awake/alert Behavior During Therapy: WFL for tasks assessed/performed Overall Cognitive Status: Within Functional Limits for tasks assessed                      General  Comments      Exercises        Assessment/Plan    PT Assessment Patient needs continued PT services  PT Diagnosis Difficulty walking;Generalized weakness   PT Problem List Decreased strength;Decreased activity tolerance;Decreased balance;Decreased mobility  PT Treatment Interventions DME instruction;Functional mobility training;Therapeutic activities;Patient/family education;Balance training;Therapeutic exercise   PT Goals (Current goals can be found in the Care Plan section) Acute Rehab PT Goals Patient Stated  Goal: get better PT Goal Formulation: With patient/family Time For Goal Achievement: 01/16/14 Potential to Achieve Goals: Fair    Frequency Min 3X/week   Barriers to discharge        Co-evaluation               End of Session Equipment Utilized During Treatment: Gait belt Activity Tolerance: Patient limited by fatigue (Dyspneic with all activity) Patient left: in bed;with call bell/phone within reach;with bed alarm set           Time: 4158-3094 PT Time Calculation (min) (ACUTE ONLY): 24 min   Charges:   PT Evaluation $Initial PT Evaluation Tier I: 1 Procedure PT Treatments $Therapeutic Activity: 23-37 mins   PT G Codes:          Rebeca Alert, MPT Pager: [phone]

## 2014-01-02 NOTE — Progress Notes (Signed)
PROGRESS NOTE  Derek Simpson UUE:[id number] DOB: April 24, 1918 DOA: 01/01/2014 PCP: Laurena Slimmer, MD  HPI: Derek Simpson is a 80 y.o. male has a past medical history significant for chronic systolic congestive heart failure with an ejection fraction of 15-20%, arthritis, hypertension, presents to the emergency room with the chief complaint of leg swelling and shortness of breath for the past few days. Patient non compliant at home with his diuretic regimen.  Subjective/ 24 H Interval events No complaints this morning, feels about the same as last night.   Assessment/Plan: Principal Problem:   Acute on chronic systolic heart failure Active Problems:   DCM (dilated cardiomyopathy)   PVC (premature ventricular contraction)   Chronic systolic CHF (congestive heart failure)   SOB (shortness of breath)   CHF (congestive heart failure)   Counseling regarding goals of care   Acute on chronic systolic heart failure - patient with known depressed ejection fraction, noncompliant with his diuretic regimen at home and appears to be in florid heart failure in the emergency room with JVD, crackles on lung exam, and bilateral lower extremity edema.  - s/p 80 mg Iv Lasix in the ED, now on 40 BID - net negative 480 mL this morning, weight ?up 168>177  Acute on chronic renal failure - patient quite sensitive to diuretics last night, question cardiorenal, avoid ACEI - consulted cardiology today, appreciate input  Goals of care  - I discussed with his daughter Doristine Counter this morning in more detail his overall clinical condition. While she has a good understanding she tells me that he wishes to be full code at this point and she did discuss this with him recently. I introduced the concept of palliative medicine and she is thinking about it and will discuss further with other family members but will not place consult right now. She reiterated her understanding that he is not a good candidate for invasive  cardiac procedures but would like to optimize his fluid status and hopefully return home.   Mild elevation of his AST and bilirubin - borderline, he has no symptoms related to this, stable this morning  NSVT - 5 beats, asymptomatic, in the setting of cardiomyopathy with depressed EF, monitor K and Mg   Diet: heart Fluids: none DVT Prophylaxis: Lovenox  Code Status: Full Family Communication: d/w daughter Doristine Counter bedside  Disposition Plan: inpatient  Consultants:  Cardiology   Procedures:  None    Antibiotics None    Studies  Dg Chest 2 View 01/01/2014 Improving pulmonary edema pattern. Continued mild edema and small bilateral effusions.     Objective  Filed Vitals:   01/01/14 1700 01/01/14 1831 01/01/14 2046 01/02/14 0413  BP:  113/87 124/76 106/86  Pulse:  101 91 90  Temp:  97.6 F (36.4 C) 97.4 F (36.3 C) 97.4 F (36.3 C)  TempSrc:  Oral Oral Oral  Resp:  22 20 20   Height: 6' (1.829 m)   6' (1.829 m)  Weight: 76.204 kg (168 lb)   80.4 kg (177 lb 4 oz)  SpO2:  99% 100% 98%    Intake/Output Summary (Last 24 hours) at 01/02/14 0715 Last data filed at 01/02/14 0500  Gross per 24 hour  Intake   1320 ml  Output   1800 ml  Net   -480 ml   Filed Weights   01/01/14 1700 01/02/14 0413  Weight: 76.204 kg (168 lb) 80.4 kg (177 lb 4 oz)    Exam:  General:  NAD  Cardiovascular: RRR  Respiratory: clear, no wheezing  Abdomen: soft, non tender  MSK: 2+ pitting edema   Data Reviewed: Basic Metabolic Panel:  Recent Labs Lab 01/01/14 1519 01/02/14 0354  NA 137 132*  K 4.3 4.0  CL 97 96  CO2 20 22  GLUCOSE 100* 96  BUN 27* 29*  CREATININE 1.31 1.48*  CALCIUM 10.0 9.2  MG  --  2.3  PHOS  --  3.4   Liver Function Tests:  Recent Labs Lab 01/01/14 1519 01/02/14 0354  AST 44* 43*  ALT 41 40  ALKPHOS 58 51  BILITOT 1.6* 1.4*  PROT 7.3 6.3  ALBUMIN 3.7 3.2*   No results for input(s): LIPASE, AMYLASE in the last 168 hours. No results for  input(s): AMMONIA in the last 168 hours. CBC:  Recent Labs Lab 01/01/14 1519 01/02/14 0354  WBC 9.4 8.0  HGB 15.1 13.6  HCT 41.2 37.2*  MCV 80.8 79.3  PLT 156 137*   Cardiac Enzymes:  Recent Labs Lab 01/01/14 1519  TROPONINI <0.30   BNP (last 3 results)  Recent Labs  10/14/13 1109 11/30/13 1436 01/01/14 1519  PROBNP 1385.0* 16389.0* 21597.0*   CBG: No results for input(s): GLUCAP in the last 168 hours.  No results found for this or any previous visit (from the past 240 hour(s)).   Scheduled Meds: . carvedilol  3.125 mg Oral BID WC  . enoxaparin (LOVENOX) injection  40 mg Subcutaneous Q24H  . furosemide  40 mg Intravenous Q12H  . sodium chloride  3 mL Intravenous Q12H   Continuous Infusions:   Time spent: 35 minutes  Pamella Pertostin Gherghe, MD [facility] Pager [phone]. If 7 PM - 7 AM, please contact night-coverage at [email or web address], password [email or web address] 01/02/2014, 7:15 AM  LOS: 1 day

## 2014-01-02 NOTE — Progress Notes (Addendum)
Pt had 5 beats of V-tach, is asymptomatic, returns to normal first degree HB with PVC's rate in 80's,  VSS has cmet, magnesium and phos already ordered this AM. Have notified Lenny Pastel no new orders at this time.  Lab to come and draw labs now  Will continue to monitor.

## 2014-01-02 NOTE — Progress Notes (Signed)
Pt c/o difficulty breathing and requested a breathing treatment. Order obtained from MD and RN gave breathing treatment. Pt stated that he felt just a little better after treatment. Pt's family came to the RN just a little while later and stated that the pt was still saying he couldn't breath and felt full. Vitals obtained and were: BP 89/71, HR 85, O2 sats 100% on 2L oxygen, Respirations were 30/min. MD paged and made aware of findings and pt complaints. MD also made aware that pt has not had any urine output since 40mg  IV lasix given at 1713. MD ordered stat CXR and stated that he will have night MD come and listen to pt's breath sounds. Will await CXR results and further MD recommendations.

## 2014-01-02 NOTE — Plan of Care (Signed)
Problem: Phase I Progression Outcomes Goal: Pain controlled with appropriate interventions Outcome: Completed/Met Date Met:  01/02/14 Goal: EF % per last Echo/documented,Core Reminder form on chart Outcome: Completed/Met Date Met:  01/02/14 Goal: Up in chair, BRP Outcome: Progressing Goal: Initial discharge plan identified Outcome: Progressing Goal: Voiding-avoid urinary catheter unless indicated Outcome: Progressing Goal: Hemodynamically stable Outcome: Progressing Goal: Other Phase I Outcomes/Goals Outcome: Progressing  Problem: Phase II Progression Outcomes Goal: Pain controlled Outcome: Completed/Met Date Met:  01/02/14

## 2014-01-02 NOTE — Progress Notes (Signed)
CARE MANAGEMENT NOTE 01/02/2014  Patient:  Derek Simpson, Derek Simpson   Account Number:  [id number]  Date Initiated:  01/02/2014  Documentation initiated by:  [facility]  Subjective/Objective Assessment:   80 Y/O M ADMITTED W/CHF.     Action/Plan:   FROM HOME.HAS W/C,CANE,RW,SCOOTER.   Anticipated DC Date:  01/05/2014   Anticipated DC Plan:  HOME W HOME HEALTH SERVICES      DC Planning Services  CM consult      Choice offered to / List presented to:             Status of service:  In process, will continue to follow Medicare Important Message given?   (If response is "NO", the following Medicare IM given date fields will be blank) Date Medicare IM given:   Medicare IM given by:   Date Additional Medicare IM given:   Additional Medicare IM given by:    Discharge Disposition:    Per UR Regulation:  Reviewed for med. necessity/level of care/duration of stay  If discussed at Long Length of Stay Meetings, dates discussed:    Comments:  01/02/14 Tawona Filsinger RN,BSN NCM [phone] PT-HH.AWAIT FINAL HHC ORDERS.

## 2014-01-03 ENCOUNTER — Ambulatory Visit: Payer: Medicare Other | Admitting: Cardiology

## 2014-01-03 DIAGNOSIS — Z66 Do not resuscitate: Secondary | ICD-10-CM | POA: Diagnosis present

## 2014-01-03 DIAGNOSIS — Z7189 Other specified counseling: Secondary | ICD-10-CM

## 2014-01-03 DIAGNOSIS — R0600 Dyspnea, unspecified: Secondary | ICD-10-CM

## 2014-01-03 DIAGNOSIS — Z515 Encounter for palliative care: Secondary | ICD-10-CM

## 2014-01-03 DIAGNOSIS — I420 Dilated cardiomyopathy: Secondary | ICD-10-CM

## 2014-01-03 LAB — BASIC METABOLIC PANEL
ANION GAP: 14 (ref 5–15)
BUN: 34 mg/dL — ABNORMAL HIGH (ref 6–23)
CHLORIDE: 97 meq/L (ref 96–112)
CO2: 23 meq/L (ref 19–32)
Calcium: 9.1 mg/dL (ref 8.4–10.5)
Creatinine, Ser: 1.69 mg/dL — ABNORMAL HIGH (ref 0.50–1.35)
GFR calc Af Amer: 38 mL/min — ABNORMAL LOW (ref >90)
GFR calc non Af Amer: 33 mL/min — ABNORMAL LOW (ref >90)
Glucose, Bld: 103 mg/dL — ABNORMAL HIGH (ref 70–99)
POTASSIUM: 3.7 meq/L (ref 3.7–5.3)
SODIUM: 134 meq/L — AB (ref 137–147)

## 2014-01-03 LAB — CBC
HCT: 37.4 % — ABNORMAL LOW (ref 39.0–52.0)
Hemoglobin: 13.7 g/dL (ref 13.0–17.0)
MCH: 29.1 pg (ref 26.0–34.0)
MCHC: 36.6 g/dL — ABNORMAL HIGH (ref 30.0–36.0)
MCV: 79.6 fL (ref 78.0–100.0)
Platelets: 144 10*3/uL — ABNORMAL LOW (ref 150–400)
RBC: 4.7 MIL/uL (ref 4.22–5.81)
RDW: 14.9 % (ref 11.5–15.5)
WBC: 7.9 10*3/uL (ref 4.0–10.5)

## 2014-01-03 MED ORDER — LEVOFLOXACIN IN D5W 750 MG/150ML IV SOLN
750.0000 mg | INTRAVENOUS | Status: DC
  Administered 2014-01-03: 750 mg via INTRAVENOUS
  Filled 2014-01-03: qty 150

## 2014-01-03 MED ORDER — MORPHINE SULFATE (CONCENTRATE) 10 MG/0.5ML PO SOLN
5.0000 mg | ORAL | Status: AC
  Administered 2014-01-03: 5 mg via ORAL
  Filled 2014-01-03: qty 0.5

## 2014-01-03 MED ORDER — BISACODYL 10 MG RE SUPP: 10.0000 mg | Freq: Every day | RECTAL | Status: DC | PRN

## 2014-01-03 MED ORDER — MORPHINE SULFATE (CONCENTRATE) 10 MG/0.5ML PO SOLN
5.0000 mg | ORAL | Status: DC | PRN
  Administered 2014-01-04: 5 mg via ORAL
  Filled 2014-01-03: qty 0.5

## 2014-01-03 NOTE — Progress Notes (Signed)
Spoke with pt and family concerning Home with Hospice. [facility] was selected, Referral called to in house rep.  Pt plan to discharge home with hospice, home address is correct in EPIC.

## 2014-01-03 NOTE — Consult Note (Signed)
Patient Derek Simpson      DOB: Jan 28, 1919      VHQ:[id number]     Consult Note from the Palliative Medicine Team at [facility]    Consult Requested by: Dr Elvera LennoxGherghe     PCP: Laurena SlimmerLARK,PRESTON S, MD Reason for Consultation:  Clarification of GOC and options    Phone Number:[phone]  Assessment of patients Current state:   Continued physical, functional and cognitive decline 2/2 to decompensating CHF, DCM and worsening associated symptoms of anorexia, SOB, fatigue and fluid weight gain.  Patient and family faced with advanced directive decisions and anticipatory care needs  Consult is for review of medical treatment options, clarification of goals of care and end of life issues, disposition and options, and symptom recommendation.  This NP Lorinda CreedMary Larach reviewed medical records, received report from team, assessed the patient and then meet at the patient's bedside along with two daughters Guy SandiferBertha Brown and Murriel HopperAmelia Donnell    to discuss diagnosis prognosis, GOC, EOL wishes disposition and options.  A detailed discussion was had today regarding advanced directives.  Concepts specific to code status, artifical feeding and hydration, continued IV antibiotics and rehospitalization was had.  The difference between a aggressive medical intervention path  and a palliative comfort care path for this patient at this time was had.  Values and goals of care important to patient and family were attempted to be elicited.  Concept of Hospice and Palliative Care were discussed  Natural trajectory and expectations at EOL were discussed.  Questions and concerns addressed.  Hard Choices booklet left for review. Family encouraged to call with questions or concerns.  PMT will continue to support holistically.   Goals of Care: 1.  Code Status:   DNR/DNI-comfort is main focus of care   2. Scope of Treatment:  Patient and family understand the overall poor prognosis and hope to discharge home with hospice  services focusing on comfort quality and dignity.  3. Disposition:   Home with hospice, will write for choice   4. Symptom Management:    1. Pain/Dyspnea: Roxanol 5 mg every 2 hrs po/sl prn 2. Bowel Regimen: Dulcolax supp daily  prn  5. Psychosocial:  Emotional support offered to patient and family at bedside.     Patient Documents Completed or Given: Document Given Completed  Advanced Directives Pkt    MOST  X  DNR    Gone from My Sight    Hard Choices X     Brief HPI:   80 y.o. male with a past medical history significant for severe cardiomyopathy of uncertain etiology presents with gradually worsening dyspnea (now with frank orthopnea), edema and cough, abdominal fullness and anorexia associated with uncertain amount of weight gain. He is generally compliant with sodium restriction, but not with daily weight monitoring. According to his family he will skip diuretic doses. Clinical, biochemical and x ray findings are all consistent with biventricular failure. Known LVEF< 20%  ROS: weakness, fatigue, SOB at rest    PMH:  Past Medical History  Diagnosis Date  . Arthritis   . PVC (premature ventricular contraction)   . CHF (congestive heart failure)      VOZ:DGUYQIHPSH:History reviewed. No pertinent past surgical history. I have reviewed the FH and SH and  If appropriate update it with new information. No Known Allergies Scheduled Meds: . enoxaparin (LOVENOX) injection  40 mg Subcutaneous Q24H  . furosemide  40 mg Intravenous Q12H  . levofloxacin (LEVAQUIN) IV  750 mg Intravenous Q48H  .  sodium chloride  3 mL Intravenous Q12H   Continuous Infusions:  PRN Meds:.sodium chloride, acetaminophen, albuterol, ondansetron (ZOFRAN) IV, sodium chloride    BP 101/71 mmHg  Pulse 77  Temp(Src) 97.2 F (36.2 C) (Oral)  Resp 26  Ht 6' (1.829 m)  Wt 80.151 kg (176 lb 11.2 oz)  BMI 23.96 kg/m2  SpO2 99%   PPS: 30 % at best   Intake/Output Summary (Last 24 hours) at 01/03/14  1049 Last data filed at 01/03/14 0958  Gross per 24 hour  Intake    240 ml  Output   1000 ml  Net   -760 ml    Physical Exam:  General: ill appearing, dyspneic at rest HEENT:  Moist buccal membranes Chest:   Decreased in bases CTA CVS: irregular  Abdomen: soft NT +BS Ext: + 2 ankle edema Neuro: alert and oriented X2  Labs: CBC    Component Value Date/Time   WBC 7.9 01/03/2014 0414   RBC 4.70 01/03/2014 0414   HGB 13.7 01/03/2014 0414   HCT 37.4* 01/03/2014 0414   PLT 144* 01/03/2014 0414   MCV 79.6 01/03/2014 0414   MCH 29.1 01/03/2014 0414   MCHC 36.6* 01/03/2014 0414   RDW 14.9 01/03/2014 0414   LYMPHSABS 1.7 11/30/2013 1436   MONOABS 0.6 11/30/2013 1436   EOSABS 0.0 11/30/2013 1436   BASOSABS 0.0 11/30/2013 1436    BMET    Component Value Date/Time   NA 134* 01/03/2014 0414   K 3.7 01/03/2014 0414   CL 97 01/03/2014 0414   CO2 23 01/03/2014 0414   GLUCOSE 103* 01/03/2014 0414   BUN 34* 01/03/2014 0414   CREATININE 1.69* 01/03/2014 0414   CALCIUM 9.1 01/03/2014 0414   GFRNONAA 33* 01/03/2014 0414   GFRAA 38* 01/03/2014 0414    CMP     Component Value Date/Time   NA 134* 01/03/2014 0414   K 3.7 01/03/2014 0414   CL 97 01/03/2014 0414   CO2 23 01/03/2014 0414   GLUCOSE 103* 01/03/2014 0414   BUN 34* 01/03/2014 0414   CREATININE 1.69* 01/03/2014 0414   CALCIUM 9.1 01/03/2014 0414   PROT 6.3 01/02/2014 0354   ALBUMIN 3.2* 01/02/2014 0354   AST 43* 01/02/2014 0354   ALT 40 01/02/2014 0354   ALKPHOS 51 01/02/2014 0354   BILITOT 1.4* 01/02/2014 0354   GFRNONAA 33* 01/03/2014 0414   GFRAA 38* 01/03/2014 0414     Time In Time Out Total Time Spent with Patient Total Overall Time  1100 1215 70 min 75 min    Greater than 50%  of this time was spent counseling and coordinating care related to the above assessment and plan.   Lorinda Creed NP  Palliative Medicine Team Team Phone # [phone] Pager [phone]  Discussed with Dr   Elvera Lennox

## 2014-01-03 NOTE — Progress Notes (Addendum)
PROGRESS NOTE  Derek Simpson ZOX:[id number] DOB: 1919/01/13 DOA: 01/01/2014 PCP: Laurena SlimmerLARK,PRESTON S, MD  HPI: Derek Simpson is a 80 y.o. male has a past medical history significant for chronic systolic congestive heart failure with an ejection fraction of 15-20%, arthritis, hypertension, presents to the emergency room with the chief complaint of leg swelling and shortness of breath for the past few days. Patient non compliant at home with his diuretic regimen.  Subjective/ 24 H Interval events - Events last night noted, patient more dyspneic, improved after the 5 PM Lasix as well as breathing treatment - he states that this morning he feels better than last night   Assessment/Plan: Principal Problem:   Acute on chronic systolic heart failure Active Problems:   DCM (dilated cardiomyopathy)   PVC (premature ventricular contraction)   Chronic systolic CHF (congestive heart failure)   SOB (shortness of breath)   CHF (congestive heart failure)   Counseling regarding goals of care   DNR (do not resuscitate)   Acute on chronic systolic heart failure - patient with known depressed ejection fraction, noncompliant with his diuretic regimen at home and appears to be in florid heart failure in the emergency room with JVD, crackles on lung exam, and bilateral lower extremity edema.  - Continue diuresing with IV Lasix, he is on 40 mg IV twice a day now  Acute on chronic renal failure - patient quite sensitive to diuretics, poor cardiac output and concern for cardiorenal syndrome - cardiology seeing, appreciate input - Overall poor prognosis  Goals of care  - I discussed with his daughter this morning, he has end-stage heart failure as well as not responding really well to diuresis with worsening of renal function. He is DO NOT RESUSCITATE, I consulted palliative care today. I suspect that if he doesn't turn around he will need hospice arrangements.  Mild elevation of his AST and bilirubin -  borderline, he has no symptoms related to this, stable   NSVT - 5 beats, asymptomatic, in the setting of cardiomyopathy with depressed EF, monitor K and Mg - Discontinued carvedilol last night due to soft blood pressure in the setting of trying to achieve diuresis  Possible bilateral lower lobe airspace opacities seen on the chest x-ray last night, add levofloxacin.  Diet: heart Fluids: none DVT Prophylaxis: Lovenox  Code Status: Full Family Communication: d/w daughter at bedside  Disposition Plan: inpatient  Consultants:  Cardiology   Palliative  Procedures:  None    Antibiotics None    Studies  - Dg Chest 2 View 01/01/2014 Improving pulmonary edema pattern. Continued mild edema and small bilateral effusions.   - DG chest 1 view 11/23 1. CHF. This is stable to slightly worsened from previous exam.2. Bilateral lower lobe opacities as above.  Objective  Filed Vitals:   01/02/14 2106 01/03/14 0400 01/03/14 0500 01/03/14 0856  BP: 98/79 101/71    Pulse: 79 77    Temp: 97.2 F (36.2 C) 97.2 F (36.2 C)    TempSrc: Oral Oral    Resp: 22 26    Height:      Weight:   80.151 kg (176 lb 11.2 oz)   SpO2: 96% 100%  99%    Intake/Output Summary (Last 24 hours) at 01/03/14 0959 Last data filed at 01/03/14 0958  Gross per 24 hour  Intake    240 ml  Output   1000 ml  Net   -760 ml   Filed Weights   01/01/14 1700 01/02/14 0413  01/03/14 0500  Weight: 76.204 kg (168 lb) 80.4 kg (177 lb 4 oz) 80.151 kg (176 lb 11.2 oz)   Exam:  General:  NAD  Cardiovascular: RRR  Respiratory: clear, no wheezing  Abdomen: soft, non tender  MSK: 2+ pitting edema   Data Reviewed: Basic Metabolic Panel:  Recent Labs Lab 01/01/14 1519 01/02/14 0354 01/03/14 0414  NA 137 132* 134*  K 4.3 4.0 3.7  CL 97 96 97  CO2 20 22 23   GLUCOSE 100* 96 103*  BUN 27* 29* 34*  CREATININE 1.31 1.48* 1.69*  CALCIUM 10.0 9.2 9.1  MG  --  2.3  --   PHOS  --  3.4  --    Liver Function  Tests:  Recent Labs Lab 01/01/14 1519 01/02/14 0354  AST 44* 43*  ALT 41 40  ALKPHOS 58 51  BILITOT 1.6* 1.4*  PROT 7.3 6.3  ALBUMIN 3.7 3.2*   CBC:  Recent Labs Lab 01/01/14 1519 01/02/14 0354 01/03/14 0414  WBC 9.4 8.0 7.9  HGB 15.1 13.6 13.7  HCT 41.2 37.2* 37.4*  MCV 80.8 79.3 79.6  PLT 156 137* 144*   Cardiac Enzymes:  Recent Labs Lab 01/01/14 1519  TROPONINI <0.30   BNP (last 3 results)  Recent Labs  10/14/13 1109 11/30/13 1436 01/01/14 1519  PROBNP 1385.0* 16389.0* 21597.0*    Scheduled Meds: . enoxaparin (LOVENOX) injection  40 mg Subcutaneous Q24H  . furosemide  40 mg Intravenous Q12H  . sodium chloride  3 mL Intravenous Q12H   Continuous Infusions:   Time spent: 25 minutes  Pamella Pert, MD [facility] Pager [phone]. If 7 PM - 7 AM, please contact night-coverage at [email or web address], password [facility] 01/03/2014, 9:59 AM  LOS: 2 days

## 2014-01-03 NOTE — Progress Notes (Signed)
ANTIBIOTIC CONSULT NOTE - INITIAL  Pharmacy Consult for Levaquin Indication: pneumonia  No Known Allergies  Patient Measurements: Height: 6' (182.9 cm) Weight: 176 lb 11.2 oz (80.151 kg) IBW/kg (Calculated) : 77.6   Vital Signs: Temp: 97.2 F (36.2 C) (11/24 0400) Temp Source: Oral (11/24 0400) BP: 101/71 mmHg (11/24 0400) Pulse Rate: 77 (11/24 0400) Intake/Output from previous day: 11/23 0701 - 11/24 0700 In: 120 [P.O.:120] Out: 1325 [Urine:1325] Intake/Output from this shift: Total I/O In: 120 [P.O.:120] Out: 500 [Urine:500]  Labs:  Recent Labs  01/01/14 1519 01/02/14 0354 01/03/14 0414  WBC 9.4 8.0 7.9  HGB 15.1 13.6 13.7  PLT 156 137* 144*  CREATININE 1.31 1.48* 1.69*   Estimated Creatinine Clearance: 30 mL/min (by C-G formula based on Cr of 1.69). No results for input(s): VANCOTROUGH, VANCOPEAK, VANCORANDOM, GENTTROUGH, GENTPEAK, GENTRANDOM, TOBRATROUGH, TOBRAPEAK, TOBRARND, AMIKACINPEAK, AMIKACINTROU, AMIKACIN in the last 72 hours.   Microbiology: No results found for this or any previous visit (from the past 720 hour(s)).  Medical History: Past Medical History  Diagnosis Date  . Arthritis   . PVC (premature ventricular contraction)   . CHF (congestive heart failure)      Assessment: 59 yoM with PMH significant for CHF presents with leg swelling and shortness of breath.  Admitted with acute on chronic CHF.  CXR showed possible bilateral lower lobe airspace opacities so pharmacy consulted to start Levaquin.  11/24 >> Levaquin >>  Tmax: AF WBC: WNL Renal: SCr 1.69 (increased, receiving Lasix), CrCl~30 ml/min QTc readings over past 24 hours: 420 ms, 450 ms, 450 ms; however, QTc on admission EKG (11/22 AM) was prolonged  Goal of Therapy:  Doses adjusted per renal function Eradication of infection  Plan:  1.  Levaquin 750 mg IV q48h. 2.  F/u SCr for dose adjustments.  F/u QTc.  Clance Boll 01/03/2014,12:40 PM

## 2014-01-03 NOTE — Progress Notes (Signed)
Patient Name: Derek Simpson Date of Encounter: 01/03/2014  Principal Problem:   Acute on chronic systolic heart failure Active Problems:   DCM (dilated cardiomyopathy)   PVC (premature ventricular contraction)   Chronic systolic CHF (congestive heart failure)   SOB (shortness of breath)   CHF (congestive heart failure)   Counseling regarding goals of care   Length of Stay: 2  SUBJECTIVE  Reasonably good UO overall, but slowing down and creat rising. Syill with dyspnea at rest  CURRENT MEDS . enoxaparin (LOVENOX) injection  40 mg Subcutaneous Q24H  . furosemide  40 mg Intravenous Q12H  . sodium chloride  3 mL Intravenous Q12H    OBJECTIVE   Intake/Output Summary (Last 24 hours) at 01/03/14 0851 Last data filed at 01/03/14 0831  Gross per 24 hour  Intake    240 ml  Output   1325 ml  Net  -1085 ml   Filed Weights   01/01/14 1700 01/02/14 0413 01/03/14 0500  Weight: 168 lb (76.204 kg) 177 lb 4 oz (80.4 kg) 176 lb 11.2 oz (80.151 kg)    PHYSICAL EXAM Filed Vitals:   01/02/14 1845 01/02/14 2106 01/03/14 0400 01/03/14 0500  BP: 89/71 98/79 101/71   Pulse: 85 79 77   Temp:  97.2 F (36.2 C) 97.2 F (36.2 C)   TempSrc:  Oral Oral   Resp: 30 22 26    Height:      Weight:    176 lb 11.2 oz (80.151 kg)  SpO2: 100% 96% 100%    General: Alert, oriented x3, mild distress. Cheyne-Stokes breathing. Head: no evidence of trauma, PERRL, EOMI, no exophtalmos or lid lag, no myxedema, no xanthelasma; normal ears, nose and oropharynx Neck: 8 cm elevation in jugular venous pulsations and prompthepatojugular reflux; brisk carotid pulses without delay and no carotid bruits Chest: a few rales and reduced breath sounds bibasilar Cardiovascular: laterally displaced apical impulse, regular rhythm with frequent ectopy, normal first heart sound and loud second heart sound, no rubs or gallops, 3/6 holosystolic murmur at LLSB (probably TR) Abdomen: no tenderness or distention, no masses by  palpation, no abnormal pulsatility or arterial bruits, normal bowel sounds, no hepatosplenomegaly Extremities: no clubbing, cyanosis; 2+ pitting pretibial symmetrical edema; 2+ radial, ulnar and brachial pulses bilaterally; 2+ right femoral, posterior tibial and dorsalis pedis pulses; 2+ left femoral, posterior tibial and dorsalis pedis pulses; no subclavian or femoral bruits Neurological: grossly nonfocal  LABS  CBC  Recent Labs  01/02/14 0354 01/03/14 0414  WBC 8.0 7.9  HGB 13.6 13.7  HCT 37.2* 37.4*  MCV 79.3 79.6  PLT 137* 144*   Basic Metabolic Panel  Recent Labs  01/02/14 0354 01/03/14 0414  NA 132* 134*  K 4.0 3.7  CL 96 97  CO2 22 23  GLUCOSE 96 103*  BUN 29* 34*  CREATININE 1.48* 1.69*  CALCIUM 9.2 9.1  MG 2.3  --   PHOS 3.4  --    Liver Function Tests  Recent Labs  01/01/14 1519 01/02/14 0354  AST 44* 43*  ALT 41 40  ALKPHOS 58 51  BILITOT 1.6* 1.4*  PROT 7.3 6.3  ALBUMIN 3.7 3.2*   No results for input(s): LIPASE, AMYLASE in the last 72 hours. Cardiac Enzymes  Recent Labs  01/01/14 1519  TROPONINI <0.30   Radiology Studies Imaging results have been reviewed and Dg Chest 2 View  01/01/2014   CLINICAL DATA:  Shortness of breath, congestive heart failure.  EXAM: CHEST  2 VIEW  COMPARISON:  11/30/2013  FINDINGS: Cardiomegaly. Improving airspace opacity, likely improving edema. Mild residual perihilar and lower lobe opacities/ edema and small effusions. No acute bony abnormality. Degenerative changes in the shoulders and lower thoracic spine.  IMPRESSION: Improving pulmonary edema pattern. Continued mild edema and small bilateral effusions.   Electronically Signed   By: Charlett NoseKevin  Dover M.D.   On: 01/01/2014 15:47   Dg Chest Port 1 View  01/02/2014   CLINICAL DATA:  Shortness of breath and cough.  Smoker  EXAM: PORTABLE CHEST - 1 VIEW  COMPARISON:  01/01/2014  FINDINGS: The heart size is moderately enlarged. There are bilateral pleural effusions and  mild interstitial edema consistent with CHF. Bilateral lower lobe airspace opacities are noted which may reflect compressive type atelectasis, edema or superimposed pneumonia.  IMPRESSION: 1. CHF.  This is stable to slightly worsened from previous exam. 2. Bilateral lower lobe opacities as above.   Electronically Signed   By: Signa Kellaylor  Stroud M.D.   On: 01/02/2014 19:18    TELE Frequent PVCs/couplets, background NSR   ASSESSMENT AND PLAN Little room for more intervention in this elderly gentleman. BP does not allow vasodilators. Due to overt signs of low cardiac output and decompensation, beta blockers are stopped. Strongly encourage a palliative care approach.   Derek FairMihai Jacky Hartung, MD, [facility] [phone] office [phone] pager 01/03/2014 8:51 AM

## 2014-01-03 NOTE — Progress Notes (Signed)
Pharmacist Heart Failure Core Measure Documentation  Assessment: Matthew A Writer has an EF documented as 15-20% on 06/16/13 by ECHO.  Rationale: Heart failure patients with left ventricular systolic dysfunction (LVSD) and an EF < 40% should be prescribed an angiotensin converting enzyme inhibitor (ACEI) or angiotensin receptor blocker (ARB) at discharge unless a contraindication is documented in the medical record.  This patient is not currently on an ACEI or ARB for HF.  This note is being placed in the record in order to provide documentation that a contraindication to the use of these agents is present for this encounter.  ACE Inhibitor or Angiotensin Receptor Blocker is contraindicated (specify all that apply)  [ ]  ACEI allergy AND ARB allergy [ ]  Angioedema [ ]  Moderate or severe aortic stenosis [ ]  Hyperkalemia [X]  Hypotension [ ]  Renal artery stenosis [X]  Worsening renal function, preexisting renal disease or dysfunction  Clance Boll, PharmD, BCPS Pager: [phone] 01/03/2014 2:48 PM

## 2014-01-03 NOTE — Progress Notes (Signed)
Follow-up:  Just prior to shift change pt had episode of SOB associated w/ coarse ("moist") BBS. He did not drop 02 sats but had persistent c/o SOB.  Pt had rec'd Lasix 40 mg IV at approx 1715 and had no UOP by 1900. MD notified and a stat PCXR was ordered. Notified by Dr Elvera Lennox regarding pt c/o SOB and pending CXR. CXR revealed stable but slightly worsened CHF and BLL opacities noted on previous CXR. RN reported at approx 2030 that pt was less SOB and now diuresing. Currently at bedside pt noted resting w/ eyes closed in NAD. RR-22 w/ 02 sats of 96-100% on 2L [place]. BBS CTA. BP's remain soft. Most recent BP 98/79. Will continue to monitor closely on telemetry.  Leanne Chang, NP-C [facility] Pager [phone]

## 2014-01-04 LAB — BASIC METABOLIC PANEL
Anion gap: 15 (ref 5–15)
BUN: 36 mg/dL — ABNORMAL HIGH (ref 6–23)
CALCIUM: 9.3 mg/dL (ref 8.4–10.5)
CO2: 22 meq/L (ref 19–32)
CREATININE: 1.73 mg/dL — AB (ref 0.50–1.35)
Chloride: 95 mEq/L — ABNORMAL LOW (ref 96–112)
GFR calc non Af Amer: 32 mL/min — ABNORMAL LOW (ref >90)
GFR, EST AFRICAN AMERICAN: 37 mL/min — AB (ref >90)
Glucose, Bld: 143 mg/dL — ABNORMAL HIGH (ref 70–99)
Potassium: 4 mEq/L (ref 3.7–5.3)
SODIUM: 132 meq/L — AB (ref 137–147)

## 2014-01-04 MED ORDER — LEVOFLOXACIN 750 MG PO TABS: 750.0000 mg | ORAL_TABLET | ORAL | Status: AC

## 2014-01-04 MED ORDER — BISACODYL 10 MG RE SUPP: 10.0000 mg | Freq: Every day | RECTAL | Status: AC | PRN

## 2014-01-04 MED ORDER — FUROSEMIDE 20 MG PO TABS: 40.0000 mg | ORAL_TABLET | Freq: Two times a day (BID) | ORAL | Status: AC

## 2014-01-04 MED ORDER — ALBUTEROL SULFATE (2.5 MG/3ML) 0.083% IN NEBU: 2.5000 mg | INHALATION_SOLUTION | Freq: Four times a day (QID) | RESPIRATORY_TRACT | Status: AC | PRN

## 2014-01-04 MED ORDER — GUAIFENESIN ER 600 MG PO TB12: 600.0000 mg | ORAL_TABLET | Freq: Two times a day (BID) | ORAL | Status: AC

## 2014-01-04 MED ORDER — MORPHINE SULFATE (CONCENTRATE) 10 MG/0.5ML PO SOLN: 5.0000 mg | ORAL | Status: AC | PRN

## 2014-01-04 MED ORDER — PROMETHAZINE HCL 25 MG RE SUPP: 25.0000 mg | Freq: Four times a day (QID) | RECTAL | Status: AC | PRN

## 2014-01-04 NOTE — Discharge Summary (Addendum)
Physician Discharge Summary  Derek Simpson MRN: [id number] DOB/AGE: 78-Jun-1920 63 y.o.  PCP: Foye Spurling, MD   Admit date: 01/01/2014 Discharge date: 01/04/2014  Discharge Diagnoses:     Acute on chronic systolic heart failure Active Problems:   DCM (dilated cardiomyopathy)   PVC (premature ventricular contraction)   Chronic systolic CHF (congestive heart failure)   SOB (shortness of breath)   CHF (congestive heart failure)   Counseling regarding goals of care   DNR (do not resuscitate)   Dyspnea   DNR (do not resuscitate) discussion   Palliative care encounter  Follow-up recommendations Follow-up with PCP in 5-7 days Patient to continue with Foley catheter at home Patient to be followed by hospice of [facility] Patient to continue with 4 L of oxygen continuous      Medication List    STOP taking these medications        amoxicillin 500 MG capsule  Commonly known as:  AMOXIL      TAKE these medications        albuterol (2.5 MG/3ML) 0.083% nebulizer solution  Commonly known as:  PROVENTIL  Take 3 mLs (2.5 mg total) by nebulization every 6 (six) hours as needed for wheezing or shortness of breath.     bisacodyl 10 MG suppository  Commonly known as:  DULCOLAX  Place 1 suppository (10 mg total) rectally daily as needed for mild constipation.     furosemide 20 MG tablet  Commonly known as:  LASIX  Take 2 tablets (40 mg total) by mouth 2 (two) times daily.     guaiFENesin 600 MG 12 hr tablet  Commonly known as:  MUCINEX  Take 1 tablet (600 mg total) by mouth 2 (two) times daily.     levofloxacin 750 MG tablet  Commonly known as:  LEVAQUIN  Take 1 tablet (750 mg total) by mouth every other day.     morphine CONCENTRATE 10 MG/0.5ML Soln concentrated solution  Take 0.25 mLs (5 mg total) by mouth every 2 (two) hours as needed for moderate pain, severe pain or shortness of breath.     promethazine 25 MG suppository  Commonly known as:  PHENERGAN   Place 1 suppository (25 mg total) rectally every 6 (six) hours as needed for nausea or vomiting.        Discharge Condition: Stable Disposition: 01-Home or Self Care   Consults: Cardiology  Significant Diagnostic Studies: Dg Chest 2 View  01/01/2014   CLINICAL DATA:  Shortness of breath, congestive heart failure.  EXAM: CHEST  2 VIEW  COMPARISON:  11/30/2013  FINDINGS: Cardiomegaly. Improving airspace opacity, likely improving edema. Mild residual perihilar and lower lobe opacities/ edema and small effusions. No acute bony abnormality. Degenerative changes in the shoulders and lower thoracic spine.  IMPRESSION: Improving pulmonary edema pattern. Continued mild edema and small bilateral effusions.   Electronically Signed   By: Rolm Baptise M.D.   On: 01/01/2014 15:47   Dg Chest Port 1 View  01/02/2014   CLINICAL DATA:  Shortness of breath and cough.  Smoker  EXAM: PORTABLE CHEST - 1 VIEW  COMPARISON:  01/01/2014  FINDINGS: The heart size is moderately enlarged. There are bilateral pleural effusions and mild interstitial edema consistent with CHF. Bilateral lower lobe airspace opacities are noted which may reflect compressive type atelectasis, edema or superimposed pneumonia.  IMPRESSION: 1. CHF.  This is stable to slightly worsened from previous exam. 2. Bilateral lower lobe opacities as above.   Electronically Signed  By: Kerby Moors M.D.   On: 01/02/2014 19:18      Microbiology: No results found for this or any previous visit (from the past 240 hour(s)).   Labs: Results for orders placed or performed during the hospital encounter of 01/01/14 (from the past 48 hour(s))  CBC     Status: Abnormal   Collection Time: 01/03/14  4:14 AM  Result Value Ref Range   WBC 7.9 4.0 - 10.5 K/uL   RBC 4.70 4.22 - 5.81 MIL/uL   Hemoglobin 13.7 13.0 - 17.0 g/dL   HCT 37.4 (L) 39.0 - 52.0 %   MCV 79.6 78.0 - 100.0 fL   MCH 29.1 26.0 - 34.0 pg   MCHC 36.6 (H) 30.0 - 36.0 g/dL   RDW 14.9 11.5  - 15.5 %   Platelets 144 (L) 150 - 400 K/uL  Basic metabolic panel     Status: Abnormal   Collection Time: 01/03/14  4:14 AM  Result Value Ref Range   Sodium 134 (L) 137 - 147 mEq/L   Potassium 3.7 3.7 - 5.3 mEq/L   Chloride 97 96 - 112 mEq/L   CO2 23 19 - 32 mEq/L   Glucose, Bld 103 (H) 70 - 99 mg/dL   BUN 34 (H) 6 - 23 mg/dL   Creatinine, Ser 1.69 (H) 0.50 - 1.35 mg/dL   Calcium 9.1 8.4 - 10.5 mg/dL   GFR calc non Af Amer 33 (L) >90 mL/min   GFR calc Af Amer 38 (L) >90 mL/min    Comment: (NOTE) The eGFR has been calculated using the CKD EPI equation. This calculation has not been validated in all clinical situations. eGFR's persistently <90 mL/min signify possible Chronic Kidney Disease.    Anion gap 14 5 - 15  Basic metabolic panel     Status: Abnormal   Collection Time: 01/04/14  4:35 AM  Result Value Ref Range   Sodium 132 (L) 137 - 147 mEq/L   Potassium 4.0 3.7 - 5.3 mEq/L   Chloride 95 (L) 96 - 112 mEq/L   CO2 22 19 - 32 mEq/L   Glucose, Bld 143 (H) 70 - 99 mg/dL   BUN 36 (H) 6 - 23 mg/dL   Creatinine, Ser 1.73 (H) 0.50 - 1.35 mg/dL   Calcium 9.3 8.4 - 10.5 mg/dL   GFR calc non Af Amer 32 (L) >90 mL/min   GFR calc Af Amer 37 (L) >90 mL/min    Comment: (NOTE) The eGFR has been calculated using the CKD EPI equation. This calculation has not been validated in all clinical situations. eGFR's persistently <90 mL/min signify possible Chronic Kidney Disease.    Anion gap 15 5 - 15     HPI :80 y.o. male with a past medical history significant for severe cardiomyopathy of uncertain etiology presents with gradually worsening dyspnea (now with frank orthopnea), edema and cough, abdominal fullness and anorexia associated with uncertain amount of weight gain. He is generally compliant with sodium restriction, but not with daily weight monitoring. According to his family he will skip diuretic doses. Clinical, biochemical and x ray findings are all consistent with biventricular  failure. Known LVEF<20%.  Last office weight 172 lb (but he was dyspneic then), now 177 lb. Baseline proBNP at most 1239, today 21597.  Good diuresis with small symptomatic improvement. Creatinine is fairly stable.  HOSPITAL COURSE:  Acute on chronic systolic heart failure - patient with known depressed ejection fraction, noncompliant with his diuretic regimen at home and appears  to be in florid heart failure in the emergency room with JVD, crackles on lung exam, and bilateral lower extremity edema.  Patient transition to Lasix IV twice a day, seen by cardiology, has terminal congestive heart failure not a candidate for surgical and medical therapies, patient has been recommended to focus on palliative care, unable to use beta blocker because of poor forward function, unable to use ACE inhibitor because of worsening creatinine, unable to use vasodilators because of low blood pressure. Currently being discharged on home oxygen. [facility]   Acute on chronic renal failure - patient quite sensitive to diuretics, poor cardiac output and concern for cardiorenal syndrome Creatinine has been worsening during this hospitalization, not a candidate for dialysis - Overall poor prognosis  Goals of care  - I discussed with his daughter this morning, he has end-stage heart failure as well as not responding really well to diuresis with worsening of renal function. He is DO NOT RESUSCITATE,  consulted palliative care . Recommend roxanol , rectal suppository for constipation Home oxygen   Mild elevation of his AST and bilirubin - borderline, he has no symptoms related to this, stable   NSVT - 5 beats, asymptomatic, in the setting of cardiomyopathy with depressed EF, monitor K and Mg - Discontinued carvedilol last night due to soft blood pressure in the setting of trying to achieve diuresis  Possible bilateral lower lobe airspace opacities seen on the chest x-ray last night, add levofloxacin  every 48 hours to be continued for another 3 doses.  Discussed with family by the bedside   Discharge Exam:  Blood pressure 87/66, pulse 83, temperature 97.2 F (36.2 C), temperature source Oral, resp. rate 24, height 6' (1.829 m), weight 80.151 kg (176 lb 11.2 oz), SpO2 97 %.  General: Alert, oriented x3, mild distress. Cheyne-Stokes breathing. Head: no evidence of trauma, PERRL, EOMI, no exophtalmos or lid lag, no myxedema, no xanthelasma; normal ears, nose and oropharynx Neck: 8 cm elevation in jugular venous pulsations and prompthepatojugular reflux; brisk carotid pulses without delay and no carotid bruits Chest: a few rales and reduced breath sounds bibasilar Cardiovascular: laterally displaced apical impulse, regular rhythm with frequent ectopy, normal first heart sound and loud second heart sound, no rubs or gallops, 3/6 holosystolic murmur at LLSB (probably TR) Abdomen: no tenderness or distention, no masses by palpation, no abnormal pulsatility or arterial bruits, normal bowel sounds, no hepatosplenomegaly Extremities: no clubbing, cyanosis; 2+ pitting pretibial symmetrical edema; 2+ radial, ulnar and brachial pulses bilaterally; 2+ right femoral, posterior tibial and dorsalis pedis pulses; 2+ left femoral, posterior tibial and dorsalis pedis pulses; no subclavian or femoral bruits Neurological: grossly nonfocal         Follow-up Information    Follow up with Foye Spurling, MD. Schedule an appointment as soon as possible for a visit in 1 week.   Specialty:  Internal Medicine   Contact information:   [address] [place] [address] [phone]        Signed: Reyne Dumas 01/04/2014, 9:36 AM

## 2014-01-04 NOTE — Progress Notes (Signed)
Notified by Melba Coon CMRN, patient and family request services of [facility]) after discharge. Pt information reviewed with Dr Elliot Gurney and hospice eligibility confirmed. Spoke with pt and daughter Lauris Poag at bedside to initiate education related to hospice services, philosophy and team approach to care they voiced good understanding of information provided. Patient very HOH- bilateral hearing aides in place- pt stated "I guess there's not much else they can do for me here"; when writer stated patient heart is very weak, both pt and daughter acknowledged his continued weakness, SOB lack of appetite and inability to do very much other than get from bed to chair at home. Daughter stated their goal was to try to get home form Thanksgiving and wanted to speak with attending physician to confirm plan was discharge today. During visit pt had episodes of what appeared to be Cheyne-Stokes breathing; these seemed to happen especially during 'difficult' discussion 'comfort approach to care and DNR' -staff RN Melissa administered 5 mg Liquid Morphine and pt did state he felt less short of breath. Discussed with daughter Lauris Poag at bedside use of liquid Morphine at home as well as room fan for feeling of air hunger; Amelia aware [facility] team will also review symptom management at home with family  Per discussion plan is to discharge home by non-emergent transport. Patient is requesting to leave foley catheter in place at discharge for comfort.  DME needs discussed pt has w/c, BSC, walker and canes at home: needs requested: Complete Oxygen Package B- which includes humidifier and nebulizer machine; pt currently on 3 L [place] continuous and PRN Nebulizer treatments; Complete Package D: fully electric hospital bed with half rails, AP&P mattress and over-bed table; call placed to [facility] equipment manager who will place order with [facility] and request [facility] representative contact daughter Piedad Climes  [phone] or c: [phone] who is at the home for delivery as soon as can be arranged.  Initial paperwork faxed to [facility]  Please notify [facility] when patient is ready to leave unit at d/c call [phone] (or [phone] if after 5 pm); [facility] information and contact numbers also given to [facility] during visit.   Above information shared with [facility] who is covering unit Please call with any questions or concerns   Valente David, RN 01/04/2014, 9:45 AM [facility] Liaison [phone]

## 2014-01-04 NOTE — Progress Notes (Signed)
PT Cancellation Note  Patient Details Name: Derek Simpson MRN: [id number] DOB: [birth year]    Cancelled Treatment:    Reason Eval/Treat Not Completed: pt is now comfort care. Will sign off.    Rebeca Alert, MPT Pager: [phone]

## 2014-01-25 ENCOUNTER — Ambulatory Visit: Payer: Medicare Other | Admitting: Cardiology

## 2014-02-10 DEATH — deceased

## 2015-03-29 IMAGING — CR DG CHEST 2V
2 series · 2 of 2 positions shown · non-contrast
Comparison: 11/30/2013

CLINICAL DATA: Shortness of breath, congestive heart failure.

EXAM:
CHEST  2 VIEW

[w chest lat]
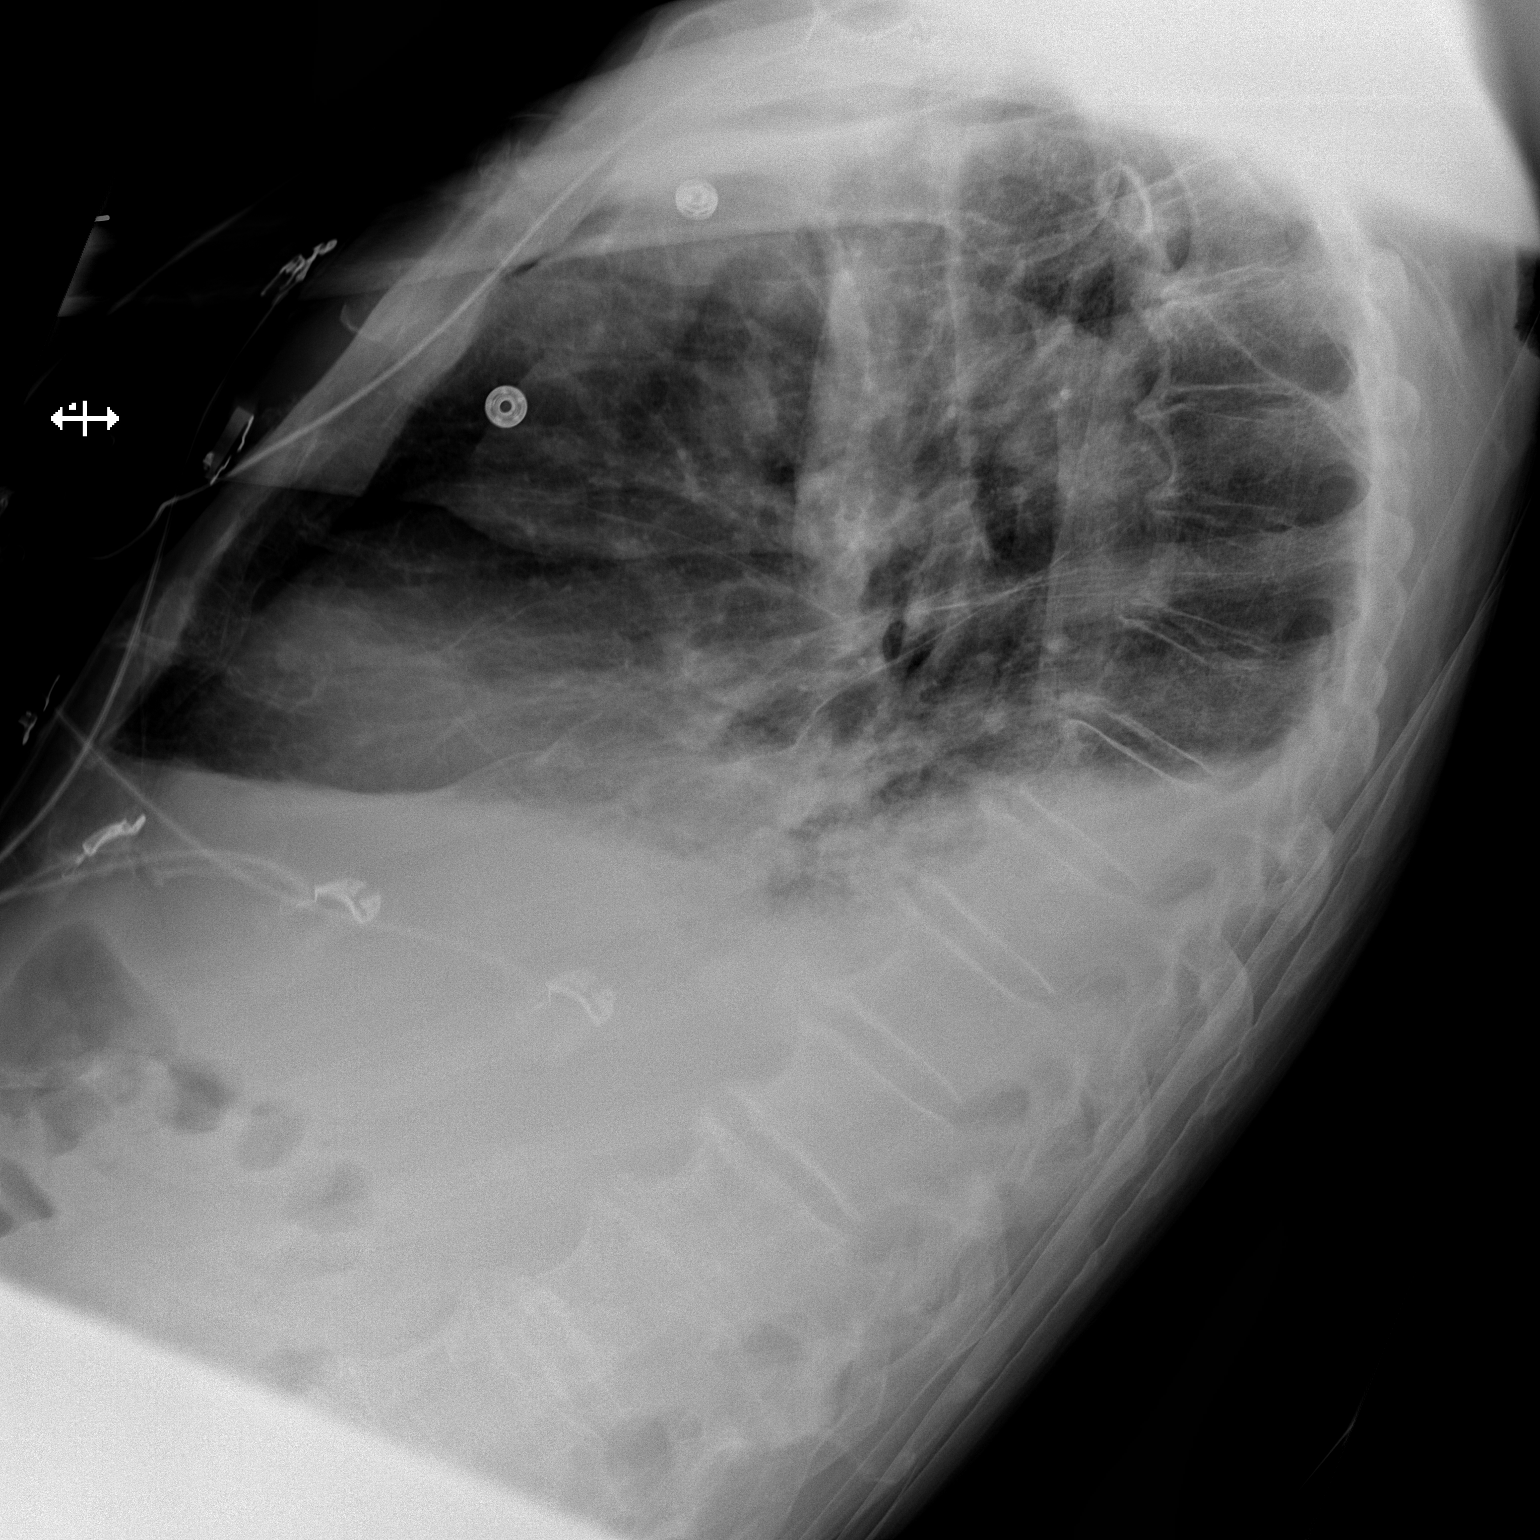

[x chest ap]
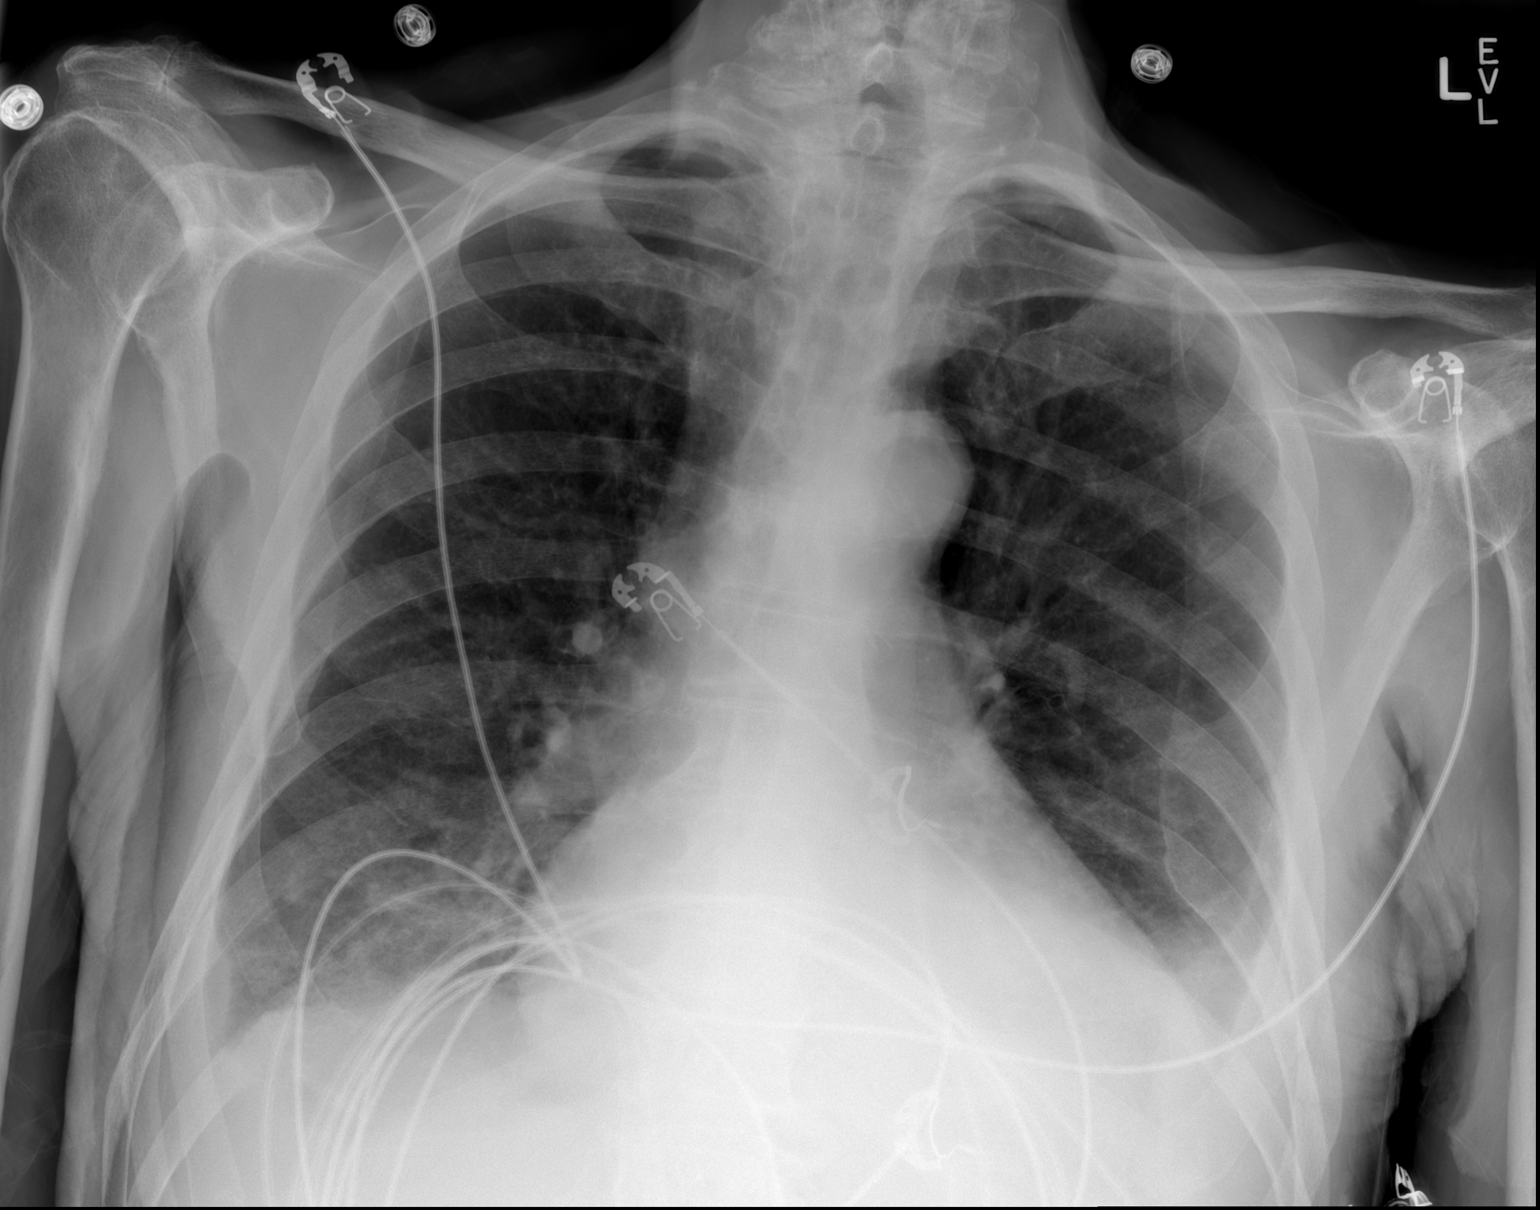

[2 of 2 positions shown; findings below may reference images not displayed]

FINDINGS: Cardiomegaly. Improving airspace opacity, likely improving edema.
Mild residual perihilar and lower lobe opacities/ edema and small
effusions. No acute bony abnormality. Degenerative changes in the
shoulders and lower thoracic spine.
IMPRESSION: Improving pulmonary edema pattern. Continued mild edema and small
bilateral effusions.

## 2015-03-30 IMAGING — DX DG CHEST 1V PORT
2 series · 2 of 2 positions shown · non-contrast
Comparison: 01/01/2014

CLINICAL DATA: Shortness of breath and cough.  Smoker

EXAM:
PORTABLE CHEST - 1 VIEW

[chest ap (1 of 2)]
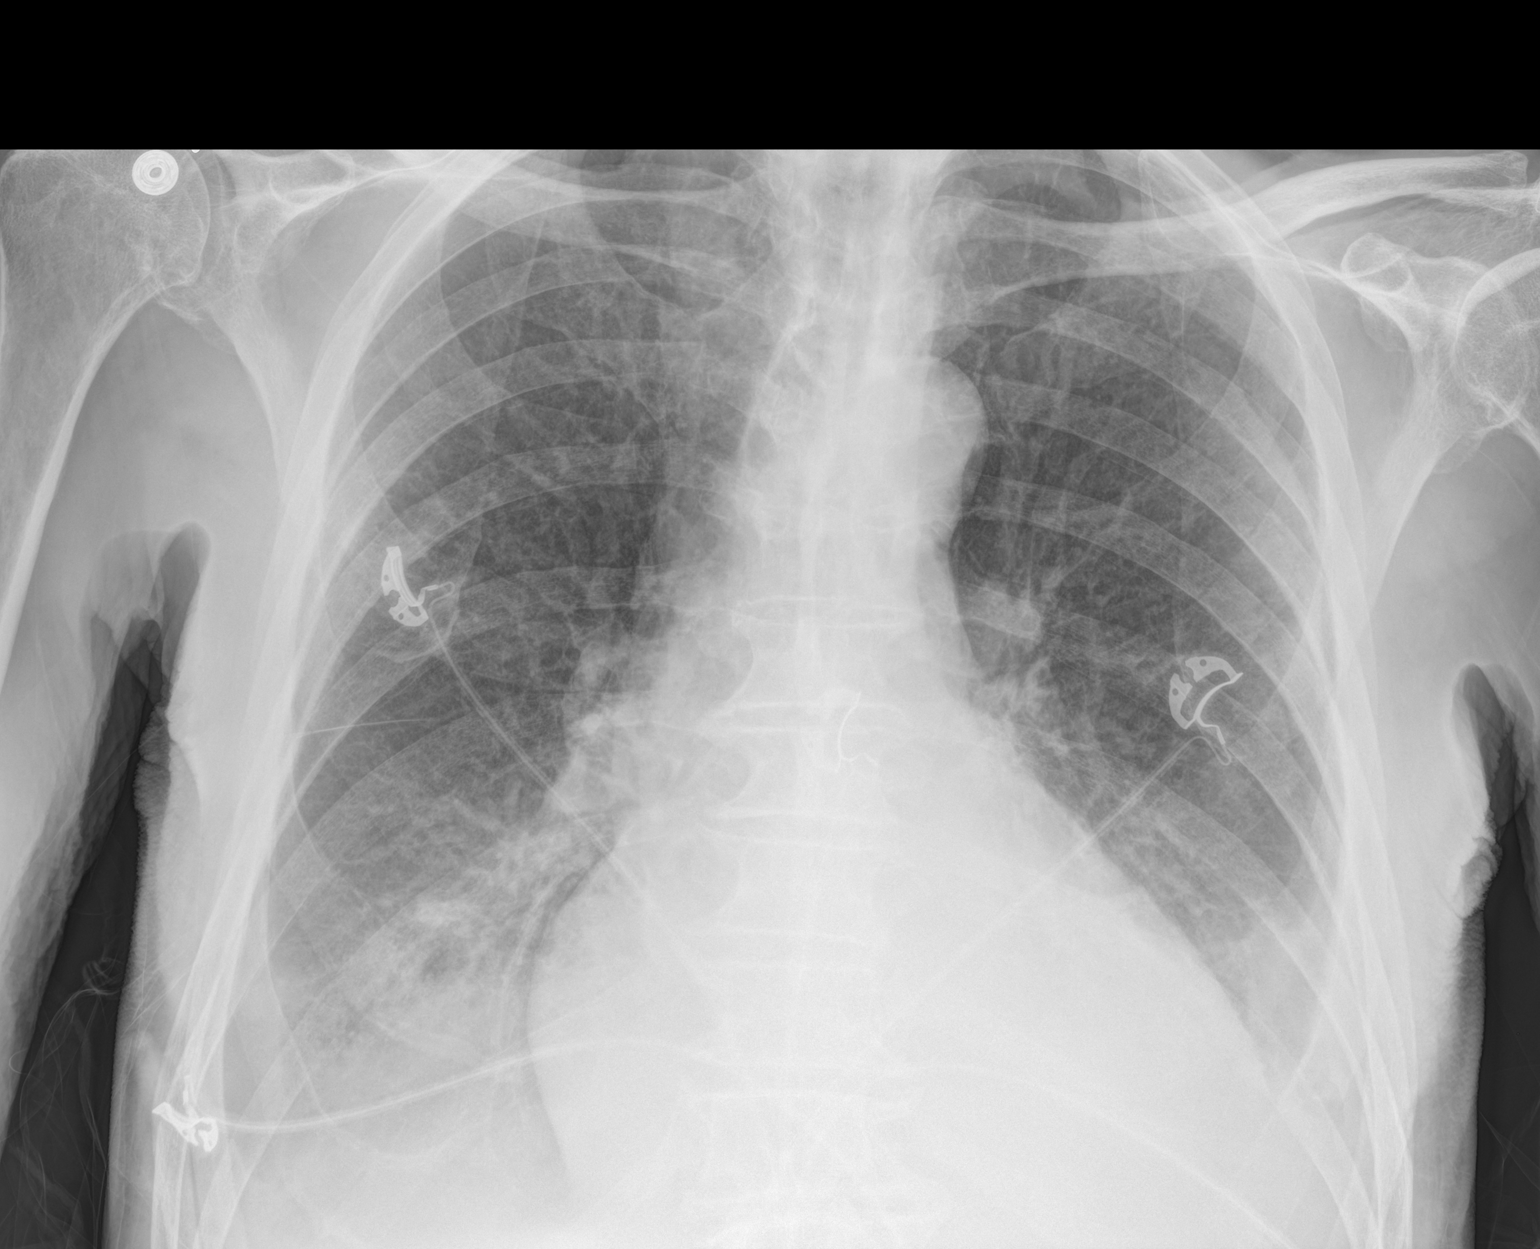

[chest ap (2 of 2)]
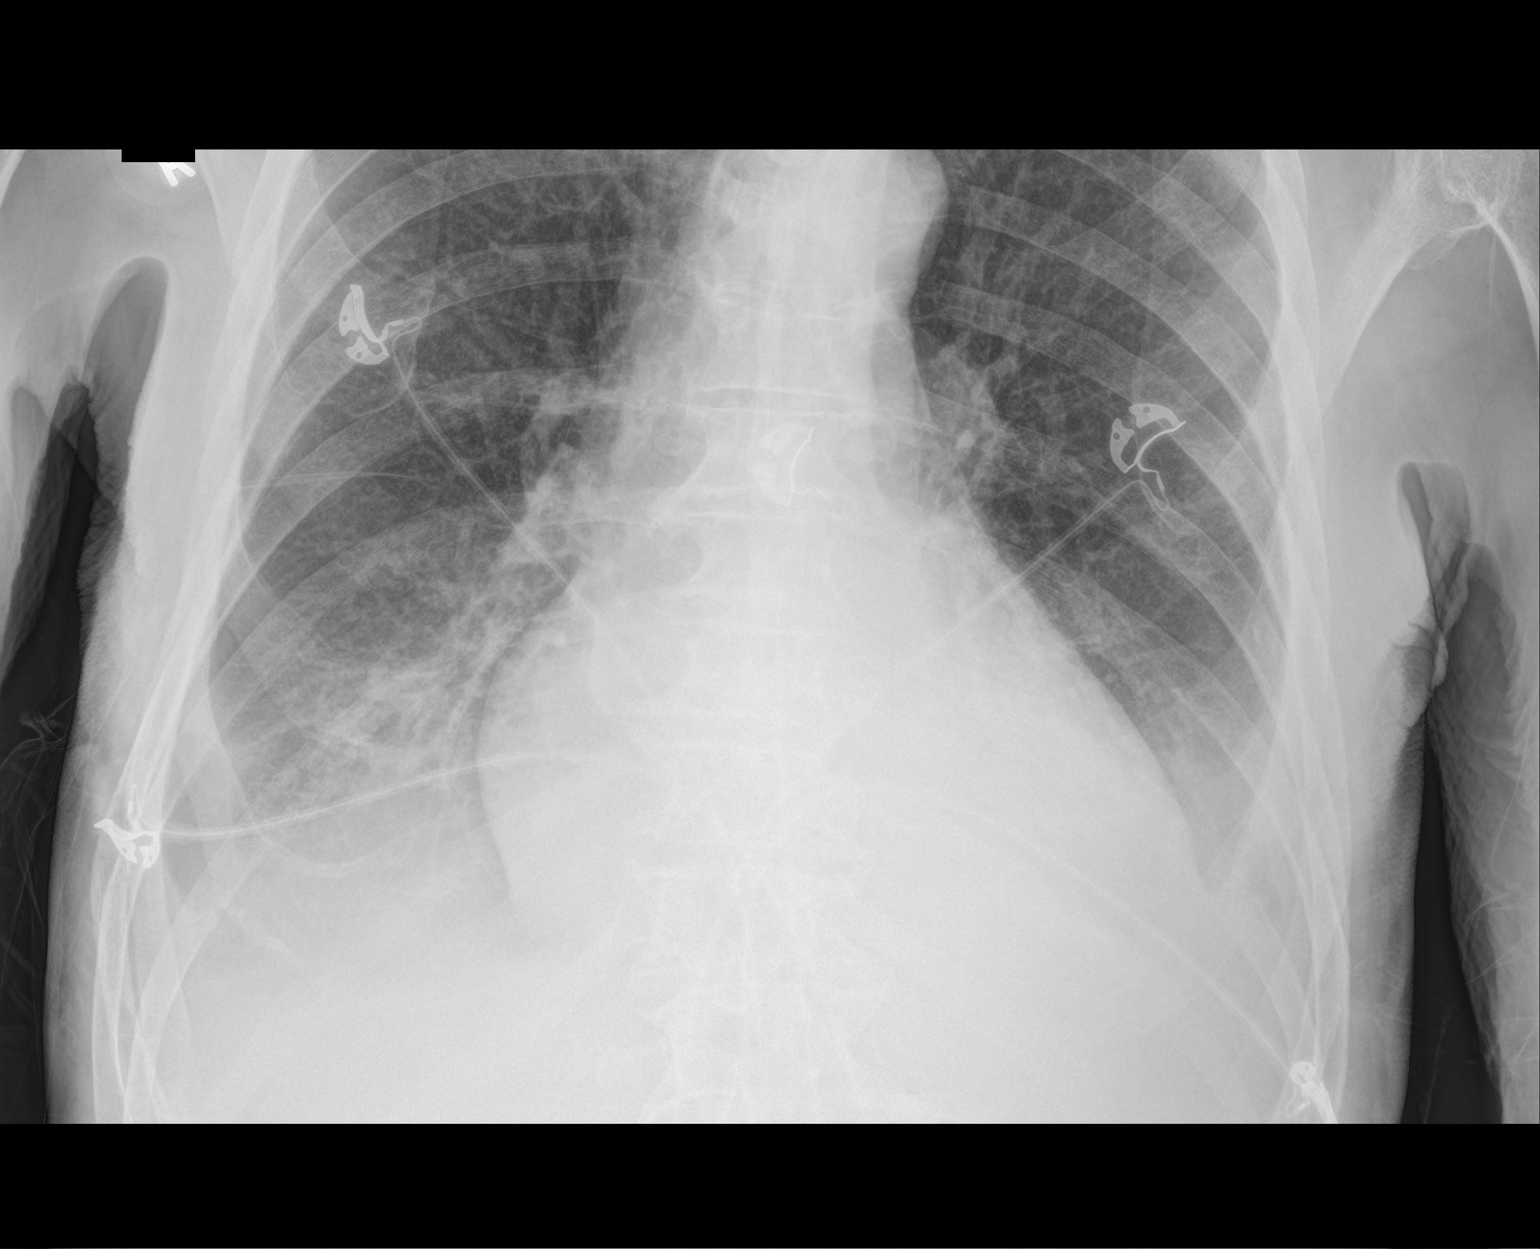

[2 of 2 positions shown; findings below may reference images not displayed]

FINDINGS: The heart size is moderately enlarged. There are bilateral pleural
effusions and mild interstitial edema consistent with CHF. Bilateral
lower lobe airspace opacities are noted which may reflect
compressive type atelectasis, edema or superimposed pneumonia.
IMPRESSION: 1. CHF.  This is stable to slightly worsened from previous exam.
2. Bilateral lower lobe opacities as above.
